# Patient Record
Sex: Female | Born: 1970
Health system: Southern US, Community
[De-identification: ages and names within clinical notes are randomized; demographics above are authoritative.]

## PROBLEM LIST (undated history)

## (undated) DIAGNOSIS — Z8489 Family history of other specified conditions: Secondary | ICD-10-CM

## (undated) DIAGNOSIS — H8110 Benign paroxysmal vertigo, unspecified ear: Secondary | ICD-10-CM

## (undated) DIAGNOSIS — T4145XA Adverse effect of unspecified anesthetic, initial encounter: Secondary | ICD-10-CM

## (undated) DIAGNOSIS — T8859XA Other complications of anesthesia, initial encounter: Secondary | ICD-10-CM

## (undated) DIAGNOSIS — T7840XA Allergy, unspecified, initial encounter: Secondary | ICD-10-CM

## (undated) DIAGNOSIS — R112 Nausea with vomiting, unspecified: Secondary | ICD-10-CM

## (undated) DIAGNOSIS — M1990 Unspecified osteoarthritis, unspecified site: Secondary | ICD-10-CM

## (undated) DIAGNOSIS — Z9889 Other specified postprocedural states: Secondary | ICD-10-CM

## (undated) HISTORY — PX: KNEE SURGERY: SHX244

## (undated) HISTORY — DX: Allergy, unspecified, initial encounter: T78.40XA

## (undated) HISTORY — PX: CHOLECYSTECTOMY: SHX55

## (undated) HISTORY — DX: Unspecified osteoarthritis, unspecified site: M19.90

## (undated) HISTORY — PX: CARPAL TUNNEL RELEASE: SHX101

## (undated) HISTORY — PX: TONSILLECTOMY: SUR1361

---

## 1998-08-11 ENCOUNTER — Inpatient Hospital Stay (HOSPITAL_COMMUNITY): Admission: AD | Admit: 1998-08-11 | Discharge: 1998-08-14 | Payer: Self-pay | Admitting: Obstetrics & Gynecology

## 1998-08-11 ENCOUNTER — Inpatient Hospital Stay (HOSPITAL_COMMUNITY): Admission: AD | Admit: 1998-08-11 | Discharge: 1998-08-11 | Payer: Self-pay | Admitting: Obstetrics and Gynecology

## 1999-09-02 ENCOUNTER — Emergency Department (HOSPITAL_COMMUNITY): Admission: EM | Admit: 1999-09-02 | Discharge: 1999-09-02 | Payer: Self-pay | Admitting: Emergency Medicine

## 1999-09-02 ENCOUNTER — Encounter: Payer: Self-pay | Admitting: Emergency Medicine

## 1999-09-30 ENCOUNTER — Other Ambulatory Visit: Admission: RE | Admit: 1999-09-30 | Discharge: 1999-09-30 | Payer: Self-pay | Admitting: Obstetrics and Gynecology

## 2000-09-03 ENCOUNTER — Ambulatory Visit (HOSPITAL_BASED_OUTPATIENT_CLINIC_OR_DEPARTMENT_OTHER): Admission: RE | Admit: 2000-09-03 | Discharge: 2000-09-03 | Payer: Self-pay

## 2000-10-04 ENCOUNTER — Other Ambulatory Visit: Admission: RE | Admit: 2000-10-04 | Discharge: 2000-10-04 | Payer: Self-pay | Admitting: Obstetrics and Gynecology

## 2001-11-14 ENCOUNTER — Other Ambulatory Visit: Admission: RE | Admit: 2001-11-14 | Discharge: 2001-11-14 | Payer: Self-pay | Admitting: Obstetrics and Gynecology

## 2002-12-06 ENCOUNTER — Other Ambulatory Visit: Admission: RE | Admit: 2002-12-06 | Discharge: 2002-12-06 | Payer: Self-pay | Admitting: Obstetrics and Gynecology

## 2004-03-05 ENCOUNTER — Other Ambulatory Visit: Admission: RE | Admit: 2004-03-05 | Discharge: 2004-03-05 | Payer: Self-pay | Admitting: Obstetrics and Gynecology

## 2005-07-01 ENCOUNTER — Other Ambulatory Visit: Admission: RE | Admit: 2005-07-01 | Discharge: 2005-07-01 | Payer: Self-pay | Admitting: Obstetrics and Gynecology

## 2009-01-23 ENCOUNTER — Ambulatory Visit: Payer: Self-pay | Admitting: Family Medicine

## 2009-01-23 DIAGNOSIS — M549 Dorsalgia, unspecified: Secondary | ICD-10-CM | POA: Insufficient documentation

## 2009-03-26 ENCOUNTER — Ambulatory Visit: Payer: Self-pay | Admitting: Internal Medicine

## 2009-03-26 DIAGNOSIS — H5500 Unspecified nystagmus: Secondary | ICD-10-CM | POA: Insufficient documentation

## 2009-03-26 DIAGNOSIS — H5702 Anisocoria: Secondary | ICD-10-CM

## 2009-03-26 DIAGNOSIS — R42 Dizziness and giddiness: Secondary | ICD-10-CM | POA: Insufficient documentation

## 2009-03-28 ENCOUNTER — Telehealth: Payer: Self-pay | Admitting: Internal Medicine

## 2009-08-14 ENCOUNTER — Ambulatory Visit: Payer: Self-pay | Admitting: Family Medicine

## 2009-08-14 DIAGNOSIS — J019 Acute sinusitis, unspecified: Secondary | ICD-10-CM

## 2009-08-20 ENCOUNTER — Telehealth (INDEPENDENT_AMBULATORY_CARE_PROVIDER_SITE_OTHER): Payer: Self-pay | Admitting: *Deleted

## 2010-11-24 ENCOUNTER — Encounter: Payer: Self-pay | Admitting: Internal Medicine

## 2011-03-20 NOTE — Op Note (Signed)
[place]  Patient:    Stacie Webster, Stacie Webster                     MRN: [id number] Proc. Date: 09/03/00 Adm. Date:  [id number] Attending:  Meredith Leeds CC:         Rocco Serene, M.D.   Operative Report  PREOPERATIVE DIAGNOSIS:  Bilateral varicose veins.  POSTOPERATIVE DIAGNOSIS:  Bilateral varicose veins.  OPERATION:  Bilateral transilluminated powered phlebectomy.  SURGEON:  Zigmund Daniel, M.D.  ANESTHESIA:  General.  DESCRIPTION OF PROCEDURE:  Before the patient came into the operating room, I had her stand and I drew circles around the areas of varicosity which were visible.  After adequate anesthesia, monitoring, and routine preparation and draping of the lower extremities, I made small incisions near the varicose clusters and infused dilute local anesthetic to provide tumescent anesthesia. I noted the varicose veins.  I made counter incisions and put in the power resector, and removed the varicose clusters.  Since there was no evidence of any abnormality of the long or short saphenous vein, and the clusters were all on the posterior and lateral aspect of the thighs and legs, I did not approach or in any way treat the long or short saphenous trunks.  After removal of the varicosities, I put a good deal more of anesthetic solution, sucked it out as much as possible, and squeezed it out.  I closed all incisions with Steri-Strips and applied a bulky compressive bandage.  The procedure was essentially identical on both legs. DD:  09/03/00 TD:  09/03/00 Job: [id number]

## 2012-09-09 ENCOUNTER — Encounter: Payer: Self-pay | Admitting: Family Medicine

## 2012-09-09 ENCOUNTER — Ambulatory Visit (INDEPENDENT_AMBULATORY_CARE_PROVIDER_SITE_OTHER): Payer: 59 | Admitting: Family Medicine

## 2012-09-09 VITALS — BP 120/84 | HR 88 | Temp 98.0°F | Resp 16 | Wt 225.4 lb

## 2012-09-09 DIAGNOSIS — L089 Local infection of the skin and subcutaneous tissue, unspecified: Secondary | ICD-10-CM | POA: Insufficient documentation

## 2012-09-09 MED ORDER — DOXYCYCLINE HYCLATE 100 MG PO TABS: 100.0000 mg | ORAL_TABLET | Freq: Two times a day (BID) | ORAL | Status: DC

## 2012-09-09 NOTE — Progress Notes (Signed)
  Subjective:    Patient ID: Stacie Webster, female    DOB: May 31, 1971, 41 y.o.   MRN: [id number]  HPI Bug bite- noticed area on L thigh on Monday AM.  As the week has gone on developed surrounding ring of redness, induration, warmth, and increased pain.  Has central hole.  [place] bite or sting.  No fevers.   Review of Systems For ROS see HPI     Objective:   Physical Exam  Vitals reviewed. Constitutional: She appears well-developed and well-nourished. No distress.  Skin: Skin is warm and dry. There is erythema (5 cm circular area of erythema w/ central induration surrounding central pore/scab.  no fluctuance or drainage.  + TTP).          Assessment & Plan:

## 2012-09-09 NOTE — Patient Instructions (Addendum)
Start the Doxy twice daily- take w/ food!! Hot compresses If the redness races past the circle- call or go to ER Hang in there!!!

## 2012-09-09 NOTE — Assessment & Plan Note (Signed)
New.  Pt w/ obvious infection on L thigh.  No area to be drained.  Start Doxy.  Reviewed supportive care and red flags that should prompt return.  Pt expressed understanding and is in agreement w/ plan.

## 2012-12-19 ENCOUNTER — Ambulatory Visit (INDEPENDENT_AMBULATORY_CARE_PROVIDER_SITE_OTHER): Payer: 59 | Admitting: Family Medicine

## 2012-12-19 ENCOUNTER — Encounter: Payer: Self-pay | Admitting: Family Medicine

## 2012-12-19 VITALS — BP 118/80 | HR 90 | Temp 98.4°F | Ht 65.0 in | Wt 229.4 lb

## 2012-12-19 DIAGNOSIS — J309 Allergic rhinitis, unspecified: Secondary | ICD-10-CM

## 2012-12-19 DIAGNOSIS — J019 Acute sinusitis, unspecified: Secondary | ICD-10-CM

## 2012-12-19 MED ORDER — AZITHROMYCIN 250 MG PO TABS: ORAL_TABLET | ORAL | Status: DC

## 2012-12-19 MED ORDER — MOMETASONE FUROATE 50 MCG/ACT NA SUSP: NASAL | Status: DC

## 2012-12-19 MED ORDER — PROMETHAZINE-DM 6.25-15 MG/5ML PO SYRP: 5.0000 mL | ORAL_SOLUTION | Freq: Four times a day (QID) | ORAL | Status: DC | PRN

## 2012-12-19 NOTE — Assessment & Plan Note (Signed)
New.  Start nasal steroid.  Reviewed supportive care and red flags that should prompt return.  Pt expressed understanding and is in agreement w/ plan.

## 2012-12-19 NOTE — Progress Notes (Signed)
  Subjective:    Patient ID: Stacie Webster, female    DOB: 1971-04-15, 42 y.o.   MRN: [id number]  HPI Sinus pressure- sxs started 5 days ago.  Prior to this had viral illness for 7-10 days.  Now w/ facial pain/pressure, L>R.  + PND.  Cough is worse at night.  No improvement w/ husband's Cheratussin.  L ear fullness.  No fever.  + sick contacts.  No tooth pain.  Allergic to cipro/PCN.  'the cough is horrendous'.   Review of Systems For ROS see HPI     Objective:   Physical Exam  Vitals reviewed. Constitutional: She appears well-developed and well-nourished. No distress.  HENT:  Head: Normocephalic and atraumatic.  Right Ear: Tympanic membrane normal.  Left Ear: Tympanic membrane normal.  Nose: Mucosal edema and rhinorrhea present. Right sinus exhibits no maxillary sinus tenderness and no frontal sinus tenderness. Left sinus exhibits maxillary sinus tenderness and frontal sinus tenderness.  Mouth/Throat: Uvula is midline and mucous membranes are normal. Posterior oropharyngeal erythema present. No oropharyngeal exudate.  Eyes: Conjunctivae and EOM are normal. Pupils are equal, round, and reactive to light.  Neck: Normal range of motion. Neck supple.  Cardiovascular: Normal rate, regular rhythm and normal heart sounds.   Pulmonary/Chest: Effort normal and breath sounds normal. No respiratory distress. She has no wheezes.  Lymphadenopathy:    She has no cervical adenopathy.          Assessment & Plan:

## 2012-12-19 NOTE — Patient Instructions (Addendum)
This is an early sinus infection Start the Zpack as directed Add Mucinex to thin your congestion Start the nasal steroid- 2 sprays each nostril daily Drink plenty of fluids REST! Hang in there!

## 2012-12-19 NOTE — Assessment & Plan Note (Signed)
Pt's sxs and PE consistent w/ infxn.  Discussed starting Biaxin but pt prefers to start Zpack.  Cough meds prn.  Reviewed supportive care and red flags that should prompt return.  Pt expressed understanding and is in agreement w/ plan.

## 2013-09-01 ENCOUNTER — Ambulatory Visit (INDEPENDENT_AMBULATORY_CARE_PROVIDER_SITE_OTHER): Payer: 59 | Admitting: Family Medicine

## 2013-09-01 ENCOUNTER — Encounter: Payer: Self-pay | Admitting: Family Medicine

## 2013-09-01 VITALS — BP 120/80 | HR 88 | Temp 98.1°F | Resp 16 | Wt 221.1 lb

## 2013-09-01 DIAGNOSIS — L02419 Cutaneous abscess of limb, unspecified: Secondary | ICD-10-CM | POA: Insufficient documentation

## 2013-09-01 MED ORDER — DOXYCYCLINE HYCLATE 100 MG PO TABS: 100.0000 mg | ORAL_TABLET | Freq: Two times a day (BID) | ORAL | Status: DC

## 2013-09-01 NOTE — Progress Notes (Signed)
  Subjective:    Patient ID: Stacie Webster, female    DOB: [birth year], 42 y.o.   MRN: [id number]  HPI Bug bite- 6 weeks ago had area that appeared to be a bug bite on R thigh.  Was able to drain the area and 'get gunk out'.  Now w/ a 'crater like scar'.  2 inches lateral to initial wound, now w/ 'exact same' lesion.  Central pore w/ surrounding scab and 3.5" of surrounding erythema and induration.  Area is itchy unless pressure is applied and then painful.  No current drainage.   Review of Systems For ROS see HPI     Objective:   Physical Exam  Vitals reviewed. Constitutional: She appears well-developed and well-nourished. No distress.  Skin: Skin is warm and dry. There is erythema.  3.5" area of erythema and induration on R anterior thigh w/ central pore.  + TTP.  No fluctuance or drainable pocket          Assessment & Plan:

## 2013-09-01 NOTE — Patient Instructions (Signed)
Follow up as needed Start the Doxy twice daily- take w/ food Continue the antibacterial soap Switch your razor Call with any questions or concerns Happy Halloween!

## 2013-09-01 NOTE — Assessment & Plan Note (Signed)
New.  Suspect pt transferred infxn from original site to current site w/ razor.  Start abx.  No I&D today.  Reviewed supportive care and red flags that should prompt return.  Pt expressed understanding and is in agreement w/ plan.

## 2013-09-05 ENCOUNTER — Ambulatory Visit (INDEPENDENT_AMBULATORY_CARE_PROVIDER_SITE_OTHER): Payer: 59 | Admitting: Family Medicine

## 2013-09-05 ENCOUNTER — Encounter: Payer: Self-pay | Admitting: Family Medicine

## 2013-09-05 ENCOUNTER — Telehealth: Payer: Self-pay | Admitting: *Deleted

## 2013-09-05 VITALS — BP 118/80 | HR 80 | Temp 98.2°F | Resp 16 | Wt 220.0 lb

## 2013-09-05 DIAGNOSIS — L02419 Cutaneous abscess of limb, unspecified: Secondary | ICD-10-CM

## 2013-09-05 NOTE — Progress Notes (Signed)
  Subjective:    Patient ID: Stacie Webster, female    DOB: 1971-01-01, 42 y.o.   MRN: [id number]  HPI Cellulitis- started on abx on Friday for cellulitis/abscess.  Sunday had increased pain and redness- 'about an inch bigger than last week'.  Today redness has improved and less painful.  Some serous drainage.  No fevers.  Tolerating Doxy w/out difficulty.   Review of Systems For ROS see HPI     Objective:   Physical Exam  Vitals reviewed. Constitutional: She appears well-developed and well-nourished. No distress.  Skin: Skin is warm and dry. There is erythema (less prominent than Friday, surrounding central indurated area w/ central pore).          Assessment & Plan:

## 2013-09-05 NOTE — Telephone Encounter (Signed)
Patient left message on triage line stating that area on leg is about 1" larger than it was on Friday but the redness has decreased. Attempted to call pt back to schedule another appt, left message on voice mail to return our call.

## 2013-09-05 NOTE — Assessment & Plan Note (Signed)
Improving.  Area less red, less painful.  No fluctuance.  Not able to I&D.  Continue Doxy.  Reassurance provided.  Will follow.

## 2013-10-27 ENCOUNTER — Ambulatory Visit (INDEPENDENT_AMBULATORY_CARE_PROVIDER_SITE_OTHER): Payer: 59 | Admitting: Family Medicine

## 2013-10-27 ENCOUNTER — Encounter: Payer: Self-pay | Admitting: Family Medicine

## 2013-10-27 VITALS — BP 128/78 | HR 118 | Temp 98.1°F | Resp 16 | Wt 219.2 lb

## 2013-10-27 DIAGNOSIS — J019 Acute sinusitis, unspecified: Secondary | ICD-10-CM

## 2013-10-27 MED ORDER — CLARITHROMYCIN 500 MG PO TABS: 500.0000 mg | ORAL_TABLET | Freq: Two times a day (BID) | ORAL | Status: DC

## 2013-10-27 MED ORDER — PROMETHAZINE-DM 6.25-15 MG/5ML PO SYRP: 5.0000 mL | ORAL_SOLUTION | Freq: Four times a day (QID) | ORAL | Status: DC | PRN

## 2013-10-27 NOTE — Progress Notes (Signed)
Pre visit review using our clinic review tool, if applicable. No additional management support is needed unless otherwise documented below in the visit note. 

## 2013-10-27 NOTE — Patient Instructions (Signed)
Follow up as needed Start the Biaxin- 1 tab twice daily, take w/ food Drink plenty of fluids REST! Use the cough syrup as needed Alternate tylenol and ibuprofen for pain/fever Call with any questions or concerns Hang in there! Happy New Year!

## 2013-10-27 NOTE — Progress Notes (Signed)
   Subjective:    Patient ID: Stacie Webster, female    DOB: [birth year], 42 y.o.   MRN: [id number]  HPI URI- sxs started Tuesday, + nasal congestion.  Yesterday developed nasal pain- worse w/ bending over.  + PND, sore throat.  Neck pain.  + tooth pain.  No N/V/D.  + sick contacts.  Mild cough due to PND.   Review of Systems For ROS see HPI     Objective:   Physical Exam  Vitals reviewed. Constitutional: She appears well-developed and well-nourished. No distress.  HENT:  Head: Normocephalic and atraumatic.  Right Ear: Tympanic membrane normal.  Left Ear: Tympanic membrane normal.  Nose: Mucosal edema and rhinorrhea present. Right sinus exhibits maxillary sinus tenderness. Right sinus exhibits no frontal sinus tenderness. Left sinus exhibits maxillary sinus tenderness. Left sinus exhibits no frontal sinus tenderness.  Mouth/Throat: Uvula is midline and mucous membranes are normal. Posterior oropharyngeal erythema present. No oropharyngeal exudate.  Eyes: Conjunctivae and EOM are normal. Pupils are equal, round, and reactive to light.  Neck: Normal range of motion. Neck supple.  Cardiovascular: Normal rate, regular rhythm and normal heart sounds.   Pulmonary/Chest: Effort normal and breath sounds normal. No respiratory distress. She has no wheezes.  Lymphadenopathy:    She has no cervical adenopathy.          Assessment & Plan:

## 2013-10-27 NOTE — Assessment & Plan Note (Signed)
Pt's sxs and PE consistent w/ infxn.  Start abx.  Cough meds prn.  Reviewed supportive care and red flags that should prompt return.  Pt expressed understanding and is in agreement w/ plan.  

## 2013-11-06 ENCOUNTER — Telehealth: Payer: Self-pay | Admitting: *Deleted

## 2013-11-06 NOTE — Telephone Encounter (Signed)
Patient left message on triage line c/o PND. Advised via vm to try OTC antihistamines and to continue nasonex as prescribed but if still no relief in a few days she can call the office to be seen again.

## 2013-11-10 ENCOUNTER — Telehealth: Payer: Self-pay | Admitting: *Deleted

## 2013-11-10 MED ORDER — DOXYCYCLINE HYCLATE 100 MG PO TABS: 100.0000 mg | ORAL_TABLET | Freq: Two times a day (BID) | ORAL | Status: DC

## 2013-11-10 NOTE — Telephone Encounter (Signed)
Med filled and pt notified.  

## 2013-11-10 NOTE — Telephone Encounter (Signed)
Ok for Doxy 100mg  BID x10 days- if no improvement after this round of abx will need f/u OV

## 2013-11-10 NOTE — Telephone Encounter (Addendum)
Patient was seen on 10/27/2013 for a sinus infection. She called back on 11/06/2013 and stated that she was not feeling better and a nurse told her to give it sometime for the antibiotic to work. Now patient stated that she is still not feeling any better and she has finished her antibiotic.

## 2013-11-15 ENCOUNTER — Telehealth: Payer: Self-pay | Admitting: *Deleted

## 2013-11-15 MED ORDER — FLUTICASONE PROPIONATE 50 MCG/ACT NA SUSP: 2.0000 | Freq: Every day | NASAL | Status: DC

## 2013-11-15 NOTE — Telephone Encounter (Signed)
Med filled, Detailed message left informing pt.

## 2013-11-15 NOTE — Telephone Encounter (Signed)
Patient called and stated that she is not better after taking doxycycline and would like to know if she would benefit from a nasal steroid. She states her main symptoms is nasal congestion. Please advise. JG//CMA

## 2013-11-15 NOTE — Telephone Encounter (Signed)
Yes- please add Flonase 2 sprays each nostril daily

## 2013-12-19 ENCOUNTER — Other Ambulatory Visit (HOSPITAL_COMMUNITY): Payer: Self-pay | Admitting: Specialist

## 2013-12-19 ENCOUNTER — Ambulatory Visit (HOSPITAL_COMMUNITY)
Admission: RE | Admit: 2013-12-19 | Discharge: 2013-12-19 | Disposition: A | Discharge status: Home / Self Care | Payer: 59 | Source: Ambulatory Visit | Attending: Specialist | Admitting: Specialist

## 2013-12-19 DIAGNOSIS — X58XXXA Exposure to other specified factors, initial encounter: Secondary | ICD-10-CM | POA: Insufficient documentation

## 2013-12-19 DIAGNOSIS — M25561 Pain in right knee: Secondary | ICD-10-CM

## 2013-12-19 DIAGNOSIS — Y9373 Activity, racquet and hand sports: Secondary | ICD-10-CM | POA: Insufficient documentation

## 2013-12-19 DIAGNOSIS — M1710 Unilateral primary osteoarthritis, unspecified knee: Secondary | ICD-10-CM | POA: Insufficient documentation

## 2016-01-09 ENCOUNTER — Encounter: Payer: Self-pay | Admitting: *Deleted

## 2016-01-09 ENCOUNTER — Emergency Department
Admission: EM | Admit: 2016-01-09 | Discharge: 2016-01-09 | Disposition: A | Discharge status: Home / Self Care | Payer: 59 | Source: Home / Self Care | Attending: Emergency Medicine | Admitting: Emergency Medicine

## 2016-01-09 DIAGNOSIS — J111 Influenza due to unidentified influenza virus with other respiratory manifestations: Secondary | ICD-10-CM | POA: Diagnosis not present

## 2016-01-09 MED ORDER — OSELTAMIVIR PHOSPHATE 75 MG PO CAPS: ORAL_CAPSULE | ORAL | Status: DC

## 2016-01-09 MED FILL — OSELTAMIVIR PHOS 75 MG CAP: 75 | 5 days supply | Qty: 10 | Fill #0

## 2016-01-09 NOTE — ED Provider Notes (Signed)
CSN: [id number]     Arrival date & time 01/09/16  1155 History   First MD Initiated Contact with Patient 01/09/16 1209     Chief Complaint  Patient presents with  . Fever  . Generalized Body Aches  . Cough    HPI 45 year old female. Her son recently had symptoms consistent with influenza  HPI : Acute onset of Flu symptoms for about 1 day. Fever to 102 with chills, sweats, myalgias, fatigue, headache. Symptoms are progressively worsening, despite trying OTC fever reducing medicine and rest and fluids. Has decreased appetite, but tolerating some liquids by mouth. No history of recent tick bite.  Review of Systems: Positive for fatigue, mild nasal congestion, mild sore throat, mild swollen anterior neck glands, mild cough. Negative for acute vision changes, stiff neck, focal weakness, syncope, seizures, respiratory distress, vomiting, diarrhea, GU symptoms, new rash.  History reviewed. No pertinent past medical history. History reviewed. No pertinent past surgical history. History reviewed. No pertinent family history. Social History  Substance Use Topics  . Smoking status: Never Smoker   . Smokeless tobacco: None  . Alcohol Use: No   OB History    No data available     Review of Systems  All other systems reviewed and are negative.   Allergies  Ciprofloxacin; Meloxicam; Penicillins; and Quinolones  Home Medications   Prior to Admission medications   Medication Sig Start Date End Date Taking? Authorizing Provider  fluticasone (FLONASE) 50 MCG/ACT nasal spray Place 2 sprays into both nostrils daily. 11/15/13   Midge Minium, MD  mometasone (NASONEX) 50 MCG/ACT nasal spray 2 sprays each nostril daily 12/19/12   Midge Minium, MD  oseltamivir (TAMIFLU) 75 MG capsule Starting today, take 1 capsule by mouth twice a day for 5 days. 01/09/16   Jacqulyn Cane, MD   Meds Ordered and Administered this Visit  Medications - No data to display  BP 129/87 mmHg  Pulse 116   Temp(Src) 98.5 F (36.9 C) (Oral)  Resp 18  Wt 231 lb (104.781 kg)  SpO2 98%  LMP 12/19/2015 No data found.   Physical Exam  Constitutional: She appears well-developed and well-nourished.  Non-toxic appearance. She appears ill (very fatigued, but no cardiorespiratory distress). No distress.  HENT:  Head: Normocephalic and atraumatic.  Right Ear: Tympanic membrane and external ear normal.  Left Ear: Tympanic membrane and external ear normal.  Nose: Rhinorrhea present.  Mouth/Throat: Mucous membranes are normal. No oropharyngeal exudate or posterior oropharyngeal erythema (Minimal injection).  Eyes: Conjunctivae are normal. Right eye exhibits no discharge. Left eye exhibits no discharge. No scleral icterus.  Neck: Neck supple.  Cardiovascular: Normal rate, regular rhythm and normal heart sounds.   Pulmonary/Chest: Breath sounds normal. No stridor. No respiratory distress. She has no wheezes. She has no rales.  Abdominal: Soft. There is no tenderness.  Musculoskeletal: She exhibits no edema.  Lymphadenopathy:    She has cervical adenopathy (mild shoddy anterior cervical nodes).  Neurological: She is alert.  Skin: Skin is warm and intact. No rash noted. She is diaphoretic.  Psychiatric: She has a normal mood and affect.  Nursing note and vitals reviewed.  Oxygen saturation 98% room air. Pulse repeated 104, regular ED Course  Procedures (including critical care time)  Labs Review Labs Reviewed - No data to display  Imaging Review No results found.    MDM   1. Influenza    Clinically has acute influenza. No evidence of bacterial infection. Given that there is local prevalence of  influenza, patient declined any flu testing and prefers to treat with Tamiflu, and I agree. Discharge Medication List as of 01/09/2016 12:50 PM    START taking these medications   Details  oseltamivir (TAMIFLU) 75 MG capsule Starting today, take 1 capsule by mouth twice a day for 5 days., Normal        after patient request and discussion of risks benefits alternatives, I was agreeable with writing a prescription for Tamiflu for her husband, Ronny Bacon date of birth 06/27/1958. He has no known drug allergies. He will fill the Tamiflu if he has acute influenza symptoms.  An After Visit Summary was printed and given to the patient.  Follow-up with your primary care doctor in 5-7 days if not improving, or sooner if symptoms become worse. Precautions discussed. Red flags discussed. Questions invited and answered. Patient voiced understanding and agreement.     Jacqulyn Cane, MD 01/09/16 [phone]

## 2016-01-09 NOTE — Discharge Instructions (Signed)

## 2016-01-09 NOTE — ED Notes (Signed)
Pt c/o fever, aches, HA, dry cough and sore throat x yesterday. T-max 102 this AM. Taken Sudafed and dayquil otc. Received flu vac this season.

## 2016-02-17 DIAGNOSIS — Z1231 Encounter for screening mammogram for malignant neoplasm of breast: Secondary | ICD-10-CM | POA: Diagnosis not present

## 2016-03-23 DIAGNOSIS — Z01419 Encounter for gynecological examination (general) (routine) without abnormal findings: Secondary | ICD-10-CM | POA: Diagnosis not present

## 2016-03-23 DIAGNOSIS — Z6839 Body mass index (BMI) 39.0-39.9, adult: Secondary | ICD-10-CM | POA: Diagnosis not present

## 2016-04-13 DIAGNOSIS — Z1321 Encounter for screening for nutritional disorder: Secondary | ICD-10-CM | POA: Diagnosis not present

## 2016-04-13 DIAGNOSIS — Z130 Encounter for screening for diseases of the blood and blood-forming organs and certain disorders involving the immune mechanism: Secondary | ICD-10-CM | POA: Diagnosis not present

## 2016-04-13 DIAGNOSIS — Z13228 Encounter for screening for other metabolic disorders: Secondary | ICD-10-CM | POA: Diagnosis not present

## 2016-04-13 DIAGNOSIS — Z1329 Encounter for screening for other suspected endocrine disorder: Secondary | ICD-10-CM | POA: Diagnosis not present

## 2016-04-13 DIAGNOSIS — Z131 Encounter for screening for diabetes mellitus: Secondary | ICD-10-CM | POA: Diagnosis not present

## 2016-04-13 DIAGNOSIS — Z13220 Encounter for screening for lipoid disorders: Secondary | ICD-10-CM | POA: Diagnosis not present

## 2016-08-21 ENCOUNTER — Telehealth: Payer: Self-pay | Admitting: Family Medicine

## 2016-08-21 NOTE — Telephone Encounter (Signed)
Pt called in today for an acute appt for swollen, red eye. Pt has not been seen in over three years and when I stated that she would need to re est she got upset and asked why and that that was insane that she not needed to see KT and that someone should have called and told her. Jess came by and heard conversation and asked if pt could be here in 30 mins for an 11:00 appt. Pt states that she is going out of town today and could not make that appt offered. Pt wants to be seen on Monday. I let her know that I would have to send a message to KT for scheduling, she then got upset again asking if you can offer me an appt for today why can you not give me one for Monday? I let pt know that KT would have to look at her schedule to see if she had time to fit in a new patient appt since there is not one avail at this time.

## 2016-08-21 NOTE — Telephone Encounter (Signed)
Ok to schedule on Monday in any available spot.  15 minutes is fine

## 2016-08-24 ENCOUNTER — Encounter: Payer: Self-pay | Admitting: Family Medicine

## 2016-08-24 ENCOUNTER — Ambulatory Visit (INDEPENDENT_AMBULATORY_CARE_PROVIDER_SITE_OTHER): Payer: 59 | Admitting: Family Medicine

## 2016-08-24 VITALS — BP 124/82 | HR 76 | Temp 98.2°F | Resp 16 | Ht 65.0 in | Wt 232.2 lb

## 2016-08-24 DIAGNOSIS — H00011 Hordeolum externum right upper eyelid: Secondary | ICD-10-CM

## 2016-08-24 MED ORDER — POLYMYXIN B-TRIMETHOPRIM 10000-0.1 UNIT/ML-% OP SOLN: OPHTHALMIC | 0 refills | Status: DC

## 2016-08-24 MED FILL — POLYMYXIN B/TMP EYE DROPS: 10000-0.1 | 5 days supply | Qty: 10 | Fill #0

## 2016-08-24 NOTE — Patient Instructions (Signed)
Follow up as needed Use the antibiotic eye drops 3x/day for next 5 days Try and avoid rubbing eyes as this can worsen inflammation Add OTC Claritin or Zyrtec for the allergy component Call with any questions or concerns Hang in there!!!

## 2016-08-24 NOTE — Progress Notes (Signed)
Pre visit review using our clinic review tool, if applicable. No additional management support is needed unless otherwise documented below in the visit note. 

## 2016-08-24 NOTE — Progress Notes (Signed)
   Subjective:    Patient ID: Stacie Webster, female    DOB: [birth year], 45 y.o.   MRN: [id number]  HPI Conjunctivitis- sxs started Wednesday.  Started warm compresses, OTC allergy drops.  Pt reports R upper eyelid was swollen Friday, Saturday, Sunday.  Yesterday had drainage of blood, pus in corner of R eye and area improved.  Has pain w/ tight closure of eye.  Yesterday a 'hard thing' expressed from upper lid leaving a 'hole'.  Mild tenderness in corner of L eye.   Review of Systems For ROS see HPI     Objective:   Physical Exam  Constitutional: She is oriented to person, place, and time. She appears well-developed and well-nourished. No distress.  HENT:  Head: Normocephalic and atraumatic.  Nose: Nose normal.  Mouth/Throat: Oropharynx is clear and moist.  Eyes: EOM are normal. Pupils are equal, round, and reactive to light. Right eye exhibits discharge. Left eye exhibits no discharge. No scleral icterus.  Infected stye of R upper eyelid in medial corner w/ surrounding erythema and induration  Neck: Normal range of motion. Neck supple.  Lymphadenopathy:    She has no cervical adenopathy.  Neurological: She is alert and oriented to person, place, and time.  Skin: Skin is warm and dry.  Psychiatric: She has a normal mood and affect. Her behavior is normal. Thought content normal.  Vitals reviewed.         Assessment & Plan:  Infected stye- new.  Center has been expressed but she has surrounding erythema and induration.  Start abx eye drops for localized infxn.  Reviewed supportive care and red flags that should prompt return.  Pt expressed understanding and is in agreement w/ plan.

## 2016-08-31 DIAGNOSIS — H0012 Chalazion right lower eyelid: Secondary | ICD-10-CM | POA: Diagnosis not present

## 2017-03-31 DIAGNOSIS — Z01419 Encounter for gynecological examination (general) (routine) without abnormal findings: Secondary | ICD-10-CM | POA: Diagnosis not present

## 2017-03-31 DIAGNOSIS — Z1231 Encounter for screening mammogram for malignant neoplasm of breast: Secondary | ICD-10-CM | POA: Diagnosis not present

## 2017-03-31 DIAGNOSIS — Z6836 Body mass index (BMI) 36.0-36.9, adult: Secondary | ICD-10-CM | POA: Diagnosis not present

## 2017-04-02 ENCOUNTER — Other Ambulatory Visit: Payer: Self-pay | Admitting: Obstetrics and Gynecology

## 2017-04-02 DIAGNOSIS — R928 Other abnormal and inconclusive findings on diagnostic imaging of breast: Secondary | ICD-10-CM

## 2017-04-05 ENCOUNTER — Ambulatory Visit
Admission: RE | Admit: 2017-04-05 | Discharge: 2017-04-05 | Disposition: A | Discharge status: Home / Self Care | Payer: 59 | Source: Ambulatory Visit | Attending: Obstetrics and Gynecology | Admitting: Obstetrics and Gynecology

## 2017-04-05 DIAGNOSIS — R928 Other abnormal and inconclusive findings on diagnostic imaging of breast: Secondary | ICD-10-CM

## 2017-04-07 ENCOUNTER — Other Ambulatory Visit: Payer: 59

## 2018-01-19 ENCOUNTER — Telehealth: Payer: Self-pay | Admitting: Internal Medicine

## 2018-01-19 NOTE — Telephone Encounter (Signed)
Dr.Gessner reviewed records and accepted patient to be scheduled for a direct colonoscopy. Patient has been scheduled for a direct colonoscopy in [place].

## 2018-01-19 NOTE — Telephone Encounter (Signed)
Pt states her father was diagnosed in his 23's with colon cancer. Records have been placed on Dr.Gessner's desk for review.

## 2018-02-21 ENCOUNTER — Ambulatory Visit (AMBULATORY_SURGERY_CENTER): Payer: Self-pay

## 2018-02-21 ENCOUNTER — Other Ambulatory Visit: Payer: Self-pay

## 2018-02-21 VITALS — Ht 65.0 in | Wt 232.0 lb

## 2018-02-21 DIAGNOSIS — Z800 Family history of malignant neoplasm of digestive organs: Secondary | ICD-10-CM

## 2018-02-21 NOTE — Progress Notes (Signed)
No egg or soy allergy known to patient  No issues with past sedation with any surgeries  or procedures, no intubation problems  No diet pills per patient No home 02 use per patient  No blood thinners per patient  Pt denies issues with constipation  No A fib or A flutter  EMMI video sent to pt's e mail , pt declined    

## 2018-02-23 ENCOUNTER — Encounter: Payer: Self-pay | Admitting: Internal Medicine

## 2018-03-09 ENCOUNTER — Other Ambulatory Visit: Payer: Self-pay

## 2018-03-09 ENCOUNTER — Ambulatory Visit (AMBULATORY_SURGERY_CENTER): Payer: 59 | Admitting: Internal Medicine

## 2018-03-09 ENCOUNTER — Encounter: Payer: Self-pay | Admitting: Internal Medicine

## 2018-03-09 VITALS — BP 108/69 | HR 75 | Temp 98.6°F | Resp 14 | Ht 65.0 in | Wt 232.0 lb

## 2018-03-09 DIAGNOSIS — Z1211 Encounter for screening for malignant neoplasm of colon: Secondary | ICD-10-CM

## 2018-03-09 DIAGNOSIS — Z800 Family history of malignant neoplasm of digestive organs: Secondary | ICD-10-CM

## 2018-03-09 MED ORDER — SODIUM CHLORIDE 0.9 % IV SOLN: 500.0000 mL | Freq: Once | INTRAVENOUS | Status: DC

## 2018-03-09 NOTE — Progress Notes (Signed)
Report given to PACU, vss 

## 2018-03-09 NOTE — Op Note (Signed)
[place] Patient Name: Stacie Webster Procedure Date: 03/09/2018 2:32 PM MRN: [id number] Endoscopist: Gatha Mayer , MD Age: 47 Referring MD:  Date of Birth: 1971/09/20 Gender: Female Account #: [id number] Procedure:                Colonoscopy Indications:              Colon cancer screening in patient at increased                            risk: Family history of colorectal cancer in                            multiple 2nd degree relatives Medicines:                Propofol per Anesthesia, Monitored Anesthesia Care Procedure:                Pre-Anesthesia Assessment:                           - Prior to the procedure, a History and Physical                            was performed, and patient medications and                            allergies were reviewed. The patient's tolerance of                            previous anesthesia was also reviewed. The risks                            and benefits of the procedure and the sedation                            options and risks were discussed with the patient.                            All questions were answered, and informed consent                            was obtained. Prior Anticoagulants: The patient has                            taken no previous anticoagulant or antiplatelet                            agents. ASA Grade Assessment: I - A normal, healthy                            patient. After reviewing the risks and benefits,                            the patient was deemed in satisfactory condition to  undergo the procedure.                           After obtaining informed consent, the colonoscope                            was passed under direct vision. Throughout the                            procedure, the patient's blood pressure, pulse, and                            oxygen saturations were monitored continuously. The                            Colonoscope was  introduced through the anus and                            advanced to the the cecum, identified by                            appendiceal orifice and ileocecal valve. The                            colonoscopy was performed without difficulty. The                            patient tolerated the procedure well. The quality                            of the bowel preparation was good. The bowel                            preparation used was Miralax. The ileocecal valve,                            appendiceal orifice, and rectum were photographed. Scope In: 2:37:31 PM Scope Out: 2:47:28 PM Scope Withdrawal Time: 0 hours 7 minutes 40 seconds  Total Procedure Duration: 0 hours 9 minutes 57 seconds  Findings:                 The perianal and digital rectal examinations were                            normal.                           Multiple diverticula were found in the sigmoid                            colon.                           The exam was otherwise without abnormality on  direct and retroflexion views. Complications:            No immediate complications. Estimated Blood Loss:     Estimated blood loss: none. Impression:               - Diverticulosis in the sigmoid colon.                           - The examination was otherwise normal on direct                            and retroflexion views.                           - No specimens collected. Recommendation:           - Patient has a contact number available for                            emergencies. The signs and symptoms of potential                            delayed complications were discussed with the                            patient. Return to normal activities tomorrow.                            Written discharge instructions were provided to the                            patient.                           - Resume previous diet.                           - Continue present  medications.                           - Repeat colonoscopy in 5 years for screening                            purposes. Father hx polyps, paternal grandmother                            and great-grandmother had colon cancer Gatha Mayer, MD 03/09/2018 2:55:04 PM This report has been signed electronically.

## 2018-03-09 NOTE — Patient Instructions (Addendum)
   No polyps today.  You do have diverticulosis - thickened muscle rings and pouches in the colon wall. Please read the handout about this condition.  Next routine colonoscopy or other screening test in 5 years 2024.  I appreciate the opportunity to care for you. Gatha Mayer, MD, FACG YOU HAD AN ENDOSCOPIC PROCEDURE TODAY AT [facility]:   Refer to the procedure report that was given to you for any specific questions about what was found during the examination.  If the procedure report does not answer your questions, please call your gastroenterologist to clarify.  If you requested that your care partner not be given the details of your procedure findings, then the procedure report has been included in a sealed envelope for you to review at your convenience later.  YOU SHOULD EXPECT: Some feelings of bloating in the abdomen. Passage of more gas than usual.  Walking can help get rid of the air that was put into your GI tract during the procedure and reduce the bloating. If you had a lower endoscopy (such as a colonoscopy or flexible sigmoidoscopy) you may notice spotting of blood in your stool or on the toilet paper. If you underwent a bowel prep for your procedure, you may not have a normal bowel movement for a few days.  Please Note:  You might notice some irritation and congestion in your nose or some drainage.  This is from the oxygen used during your procedure.  There is no need for concern and it should clear up in a day or so.  SYMPTOMS TO REPORT IMMEDIATELY:   Following lower endoscopy (colonoscopy or flexible sigmoidoscopy):  Excessive amounts of blood in the stool  Significant tenderness or worsening of abdominal pains  Swelling of the abdomen that is new, acute  Fever of 100F or higher  For urgent or emergent issues, a gastroenterologist can be reached at any hour by calling [phone].   DIET:  We do recommend a small meal at first, but then you  may proceed to your regular diet.  Drink plenty of fluids but you should avoid alcoholic beverages for 24 hours.  ACTIVITY:  You should plan to take it easy for the rest of today and you should NOT DRIVE or use heavy machinery until tomorrow (because of the sedation medicines used during the test).    FOLLOW UP: Our staff will call the number listed on your records the next business day following your procedure to check on you and address any questions or concerns that you may have regarding the information given to you following your procedure. If we do not reach you, we will leave a message.  However, if you are feeling well and you are not experiencing any problems, there is no need to return our call.  We will assume that you have returned to your regular daily activities without incident.  If any biopsies were taken you will be contacted by phone or by letter within the next 1-3 weeks.  Please call us at [phone] if you have not heard about the biopsies in 3 weeks.   Diverticulosis (handout given)   SIGNATURES/CONFIDENTIALITY: You and/or your care partner have signed paperwork which will be entered into your electronic medical record.  These signatures attest to the fact that that the information above on your After Visit Summary has been reviewed and is understood.  Full responsibility of the confidentiality of this discharge information lies with you and/or your care-partner.

## 2018-03-09 NOTE — Progress Notes (Signed)
Pt's states no medical or surgical changes since previsit or office visit. 

## 2018-03-10 ENCOUNTER — Telehealth: Payer: Self-pay

## 2018-03-10 NOTE — Telephone Encounter (Signed)
  Follow up Call-  Call back number 03/09/2018  Post procedure Call Back phone  # [phone]  Permission to leave phone message Yes  Some recent data might be hidden     Patient questions:  Do you have a fever, pain , or abdominal swelling? No. Pain Score  0 *  Have you tolerated food without any problems? Yes.    Have you been able to return to your normal activities? Yes.    Do you have any questions about your discharge instructions: Diet   No. Medications  No. Follow up visit  No.  Do you have questions or concerns about your Care? No.  Actions: * If pain score is 4 or above: No action needed, pain <4.

## 2018-04-27 DIAGNOSIS — M1711 Unilateral primary osteoarthritis, right knee: Secondary | ICD-10-CM | POA: Diagnosis not present

## 2018-05-11 MED FILL — traMADol HCL 50 MG TABS: 50 | 8 days supply | Qty: 30 | Fill #0

## 2018-06-01 DIAGNOSIS — Z1231 Encounter for screening mammogram for malignant neoplasm of breast: Secondary | ICD-10-CM | POA: Diagnosis not present

## 2018-06-01 DIAGNOSIS — Z6839 Body mass index (BMI) 39.0-39.9, adult: Secondary | ICD-10-CM | POA: Diagnosis not present

## 2018-06-01 DIAGNOSIS — Z01419 Encounter for gynecological examination (general) (routine) without abnormal findings: Secondary | ICD-10-CM | POA: Diagnosis not present

## 2018-06-15 DIAGNOSIS — M1711 Unilateral primary osteoarthritis, right knee: Secondary | ICD-10-CM | POA: Diagnosis not present

## 2018-06-22 DIAGNOSIS — M1711 Unilateral primary osteoarthritis, right knee: Secondary | ICD-10-CM | POA: Diagnosis not present

## 2018-06-29 DIAGNOSIS — M1711 Unilateral primary osteoarthritis, right knee: Secondary | ICD-10-CM | POA: Diagnosis not present

## 2018-06-30 LAB — HM MAMMOGRAPHY: HM Mammogram: NORMAL (ref 0–4)

## 2018-06-30 LAB — HM PAP SMEAR

## 2018-08-08 DIAGNOSIS — M1711 Unilateral primary osteoarthritis, right knee: Secondary | ICD-10-CM | POA: Diagnosis not present

## 2018-08-08 DIAGNOSIS — M25569 Pain in unspecified knee: Secondary | ICD-10-CM | POA: Diagnosis not present

## 2018-08-08 MED FILL — CELECOXIB 200 MG CAP: 200 | 30 days supply | Qty: 30 | Fill #0

## 2018-08-29 DIAGNOSIS — D224 Melanocytic nevi of scalp and neck: Secondary | ICD-10-CM | POA: Diagnosis not present

## 2018-08-29 DIAGNOSIS — L430 Hypertrophic lichen planus: Secondary | ICD-10-CM | POA: Diagnosis not present

## 2018-08-29 DIAGNOSIS — D2262 Melanocytic nevi of left upper limb, including shoulder: Secondary | ICD-10-CM | POA: Diagnosis not present

## 2018-08-29 DIAGNOSIS — D485 Neoplasm of uncertain behavior of skin: Secondary | ICD-10-CM | POA: Diagnosis not present

## 2018-08-29 DIAGNOSIS — D1801 Hemangioma of skin and subcutaneous tissue: Secondary | ICD-10-CM | POA: Diagnosis not present

## 2018-08-29 DIAGNOSIS — D2261 Melanocytic nevi of right upper limb, including shoulder: Secondary | ICD-10-CM | POA: Diagnosis not present

## 2018-08-29 DIAGNOSIS — D225 Melanocytic nevi of trunk: Secondary | ICD-10-CM | POA: Diagnosis not present

## 2018-08-29 DIAGNOSIS — L821 Other seborrheic keratosis: Secondary | ICD-10-CM | POA: Diagnosis not present

## 2018-09-05 MED FILL — CELECOXIB 200 MG CAP: 200 | 30 days supply | Qty: 30 | Fill #1

## 2018-10-10 MED FILL — CELECOXIB 200 MG CAP: 200 | 30 days supply | Qty: 30 | Fill #2

## 2018-11-07 ENCOUNTER — Encounter: Payer: Self-pay | Admitting: Family Medicine

## 2018-11-07 ENCOUNTER — Ambulatory Visit (INDEPENDENT_AMBULATORY_CARE_PROVIDER_SITE_OTHER): Payer: No Typology Code available for payment source | Admitting: Family Medicine

## 2018-11-07 ENCOUNTER — Other Ambulatory Visit: Payer: Self-pay

## 2018-11-07 VITALS — BP 138/88 | HR 86 | Temp 97.9°F | Resp 16 | Ht 65.5 in | Wt 242.4 lb

## 2018-11-07 DIAGNOSIS — E669 Obesity, unspecified: Secondary | ICD-10-CM | POA: Diagnosis not present

## 2018-11-07 DIAGNOSIS — M25561 Pain in right knee: Secondary | ICD-10-CM

## 2018-11-07 DIAGNOSIS — G8929 Other chronic pain: Secondary | ICD-10-CM

## 2018-11-07 NOTE — Assessment & Plan Note (Signed)
Ongoing issue for pt but deteriorated recently w/ weight gain.  Pt declines labs, says she has this done at GYN.  Encouraged healthy diet and regular exercise.  Will follow.

## 2018-11-07 NOTE — Progress Notes (Signed)
   Subjective:    Patient ID: Stacie Webster, female    DOB: June 03, 1971, 48 y.o.   MRN: [id number]  HPI Obesity- pt's up 10 lbs since last visit.  BMI now 39.72.  Playing tennis but limited exercise due to knee pain.  Pt declines lab work today.  No CP, SOB, HAs, visual changes, edema.  R knee pain- has been seeing Ortho.  Has upcoming appt and plan is to proceed w/ MRI.  Has been seeing Dr Theda Sers at [facility].  UTD on mammo, pap  Review of Systems For ROS see HPI     Objective:   Physical Exam Vitals signs reviewed.  Constitutional:      General: She is not in acute distress.    Appearance: She is well-developed. She is obese.  HENT:     Head: Normocephalic and atraumatic.  Eyes:     Conjunctiva/sclera: Conjunctivae normal.     Pupils: Pupils are equal, round, and reactive to light.  Neck:     Musculoskeletal: Normal range of motion and neck supple.     Thyroid: No thyromegaly.  Cardiovascular:     Rate and Rhythm: Normal rate and regular rhythm.     Heart sounds: Normal heart sounds. No murmur.  Pulmonary:     Effort: Pulmonary effort is normal. No respiratory distress.     Breath sounds: Normal breath sounds.  Abdominal:     General: There is no distension.     Palpations: Abdomen is soft.     Tenderness: There is no abdominal tenderness.  Lymphadenopathy:     Cervical: No cervical adenopathy.  Skin:    General: Skin is warm and dry.  Neurological:     Mental Status: She is alert and oriented to person, place, and time.  Psychiatric:        Behavior: Behavior normal.           Assessment & Plan:  Chronic knee pain- new to provider, ongoing for pt.  She is already seeing Dr Theda Sers at [facility] but due to a change in insurance, needs a referral placed.

## 2018-11-07 NOTE — Patient Instructions (Signed)
Schedule your complete physical at your convenience (insurance will require it) We'll put your referral in for ortho- don't forget the [facility] app! Call with any questions or concerns Happy New Year!!!

## 2018-11-21 MED FILL — CELECOXIB 200 MG CAP: 200 | 30 days supply | Qty: 30 | Fill #3

## 2018-11-23 ENCOUNTER — Telehealth: Payer: Self-pay | Admitting: Family Medicine

## 2018-11-23 NOTE — Telephone Encounter (Signed)
I have placed a preoperative eval., form up front in the bin with a chargesheet. Please fax to [facility] and call pt to let her know it has been taken care of.

## 2018-11-23 NOTE — Telephone Encounter (Signed)
Paperwork given to PCP for review.  

## 2018-11-24 ENCOUNTER — Encounter: Payer: Self-pay | Admitting: Family Medicine

## 2018-11-24 NOTE — Telephone Encounter (Signed)
Stacie Webster called patient and she has been scheduled for 11/30/17.

## 2018-11-24 NOTE — Telephone Encounter (Signed)
Pt will need surgical clearance appt.  She was notified via [place]

## 2018-11-26 IMAGING — MG 2D DIGITAL DIAGNOSTIC UNILATERAL RIGHT MAMMOGRAM WITH CAD AND AD
6 of 9 series · 6 of 21 positions shown · non-contrast
Comparison: 03/31/2017 and earlier

CLINICAL DATA: Patient returns after screening study for evaluation
of possible right breast asymmetry.

EXAM:
2D DIGITAL DIAGNOSTIC UNILATERAL RIGHT MAMMOGRAM WITH CAD AND
ADJUNCT TOMO

[R ML synth-2D]
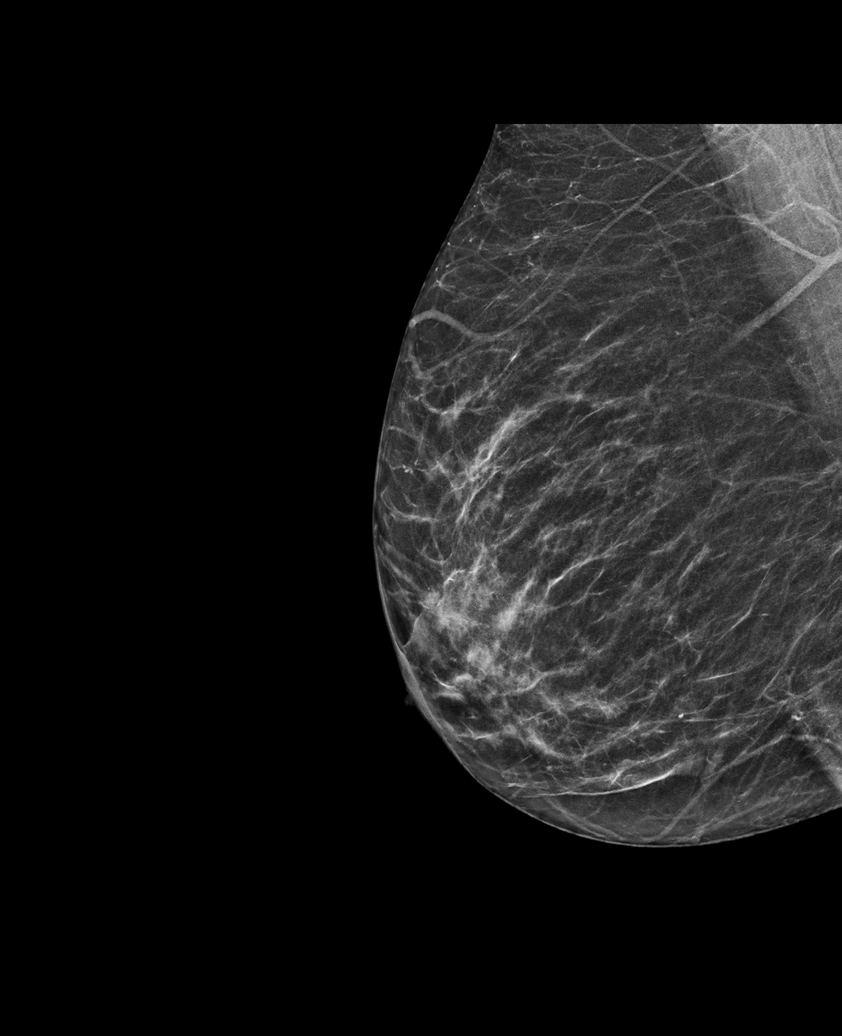

[R CC (1 of 2)]
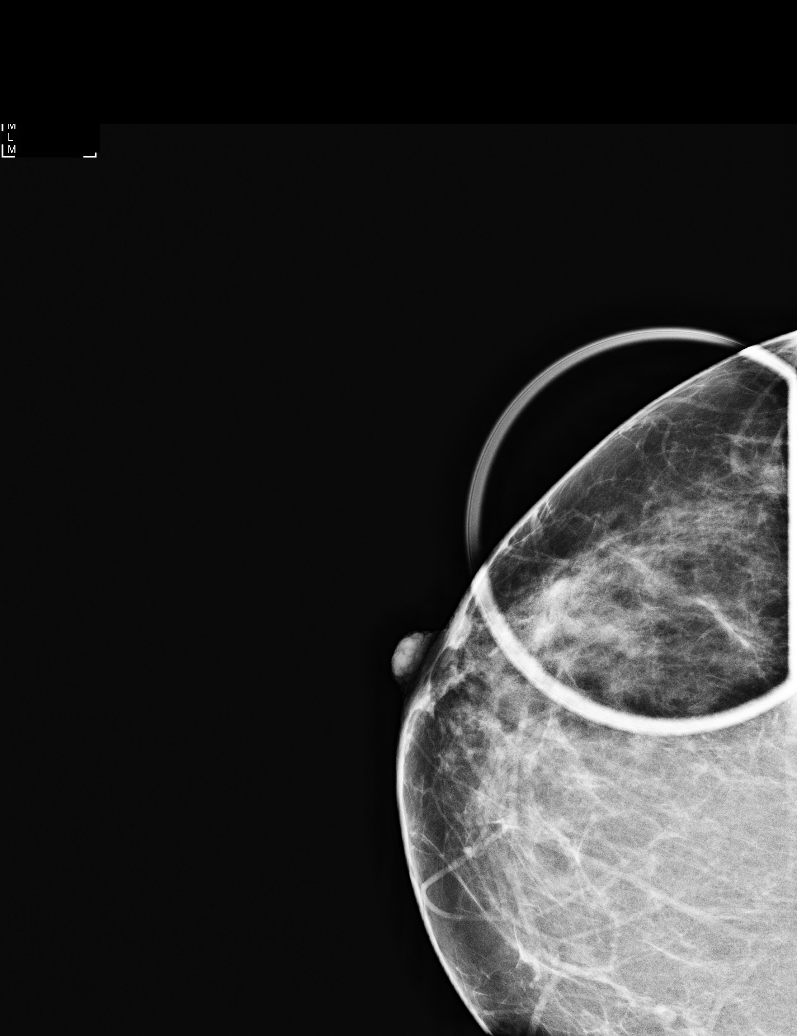

[R CC synth-2D (1 of 2)]
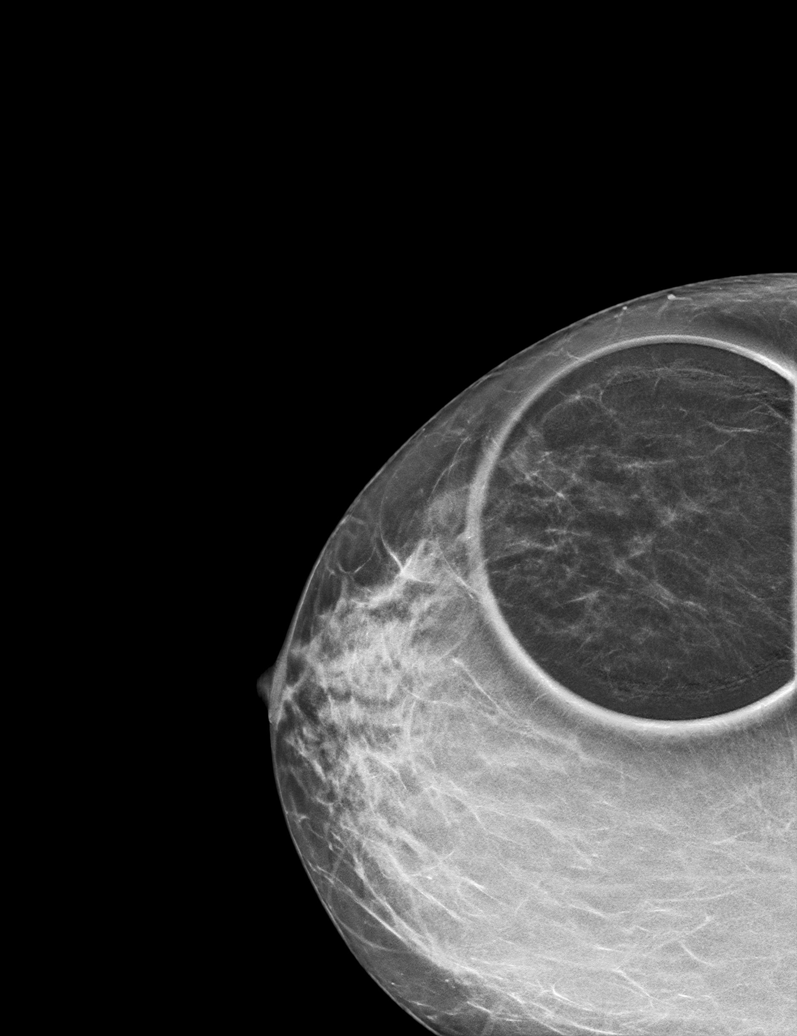

[R CC (2 of 2)]
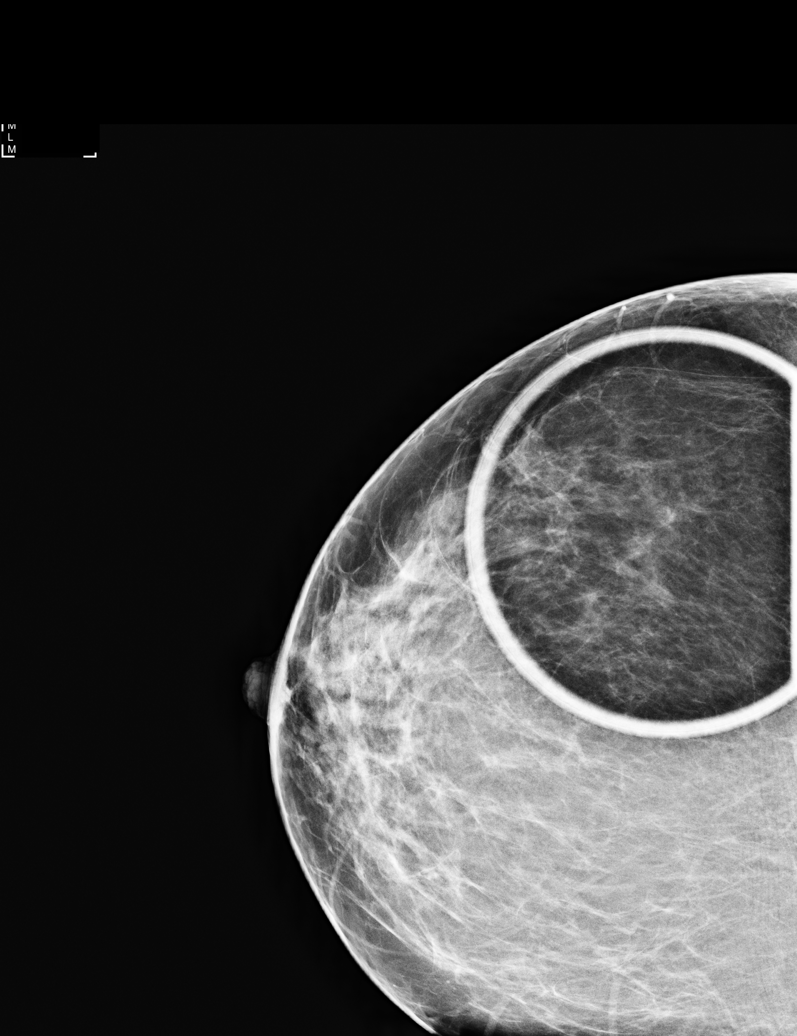

[R CC synth-2D (2 of 2)]
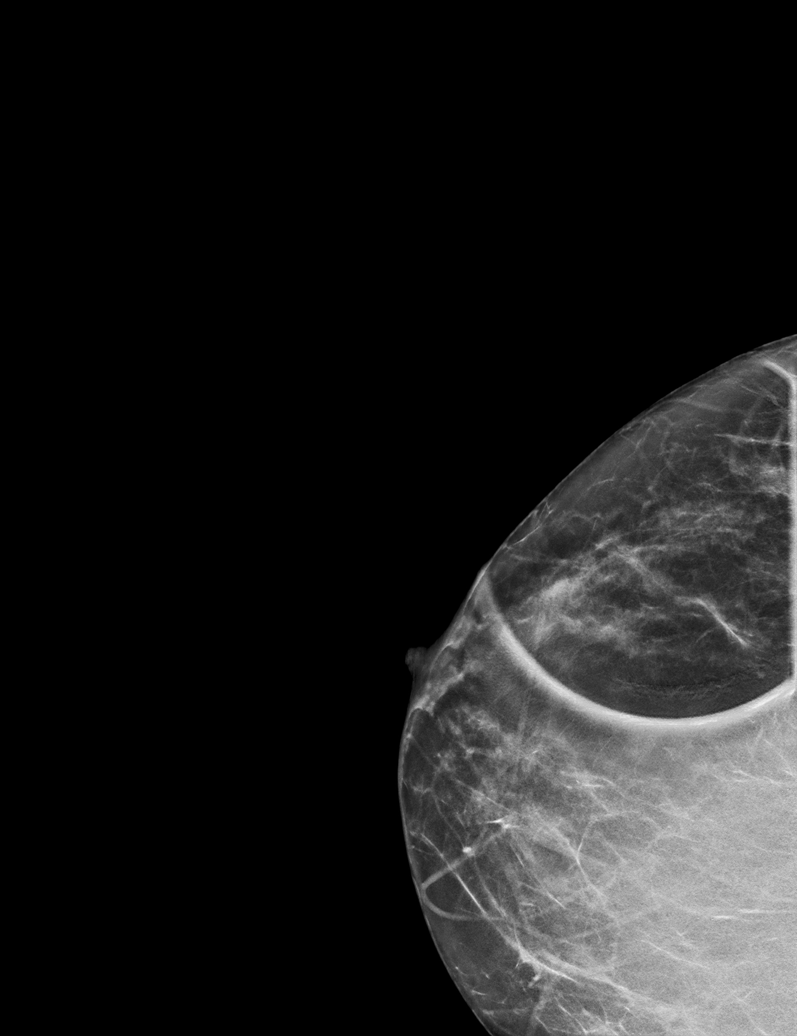

[R ML]
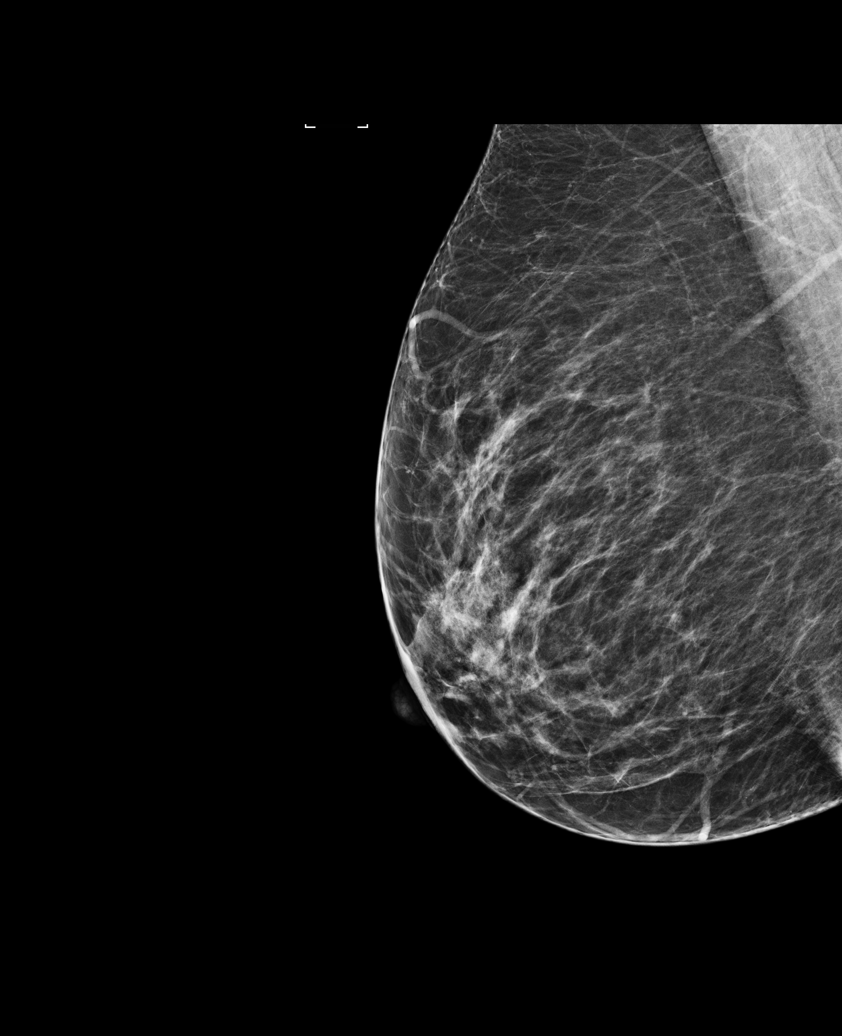

[6 of 21 positions shown; findings below may reference images not displayed]

ACR Breast Density Category b: There are scattered areas of
fibroglandular density.
FINDINGS: Additional 2-D and 3-D images are performed. No persistent
abnormality is identified in the lateral portion of the right
breast. No suspicious mass, distortion, or microcalcifications are
identified to suggest presence of malignancy.

Mammographic images were processed with CAD.
IMPRESSION: No mammographic evidence for malignancy.

RECOMMENDATION:
Screening mammogram in one year.(Code:CL-U-06F)

I have discussed the findings and recommendations with the patient.
Results were also provided in writing at the conclusion of the
visit. If applicable, a reminder letter will be sent to the patient
regarding the next appointment.

BI-RADS CATEGORY  1: Negative.

## 2018-11-30 ENCOUNTER — Encounter: Payer: Self-pay | Admitting: Family Medicine

## 2018-11-30 ENCOUNTER — Other Ambulatory Visit: Payer: Self-pay

## 2018-11-30 ENCOUNTER — Ambulatory Visit (INDEPENDENT_AMBULATORY_CARE_PROVIDER_SITE_OTHER): Payer: No Typology Code available for payment source | Admitting: Family Medicine

## 2018-11-30 VITALS — BP 121/80 | HR 77 | Temp 98.6°F | Resp 16 | Ht 66.0 in | Wt 239.2 lb

## 2018-11-30 DIAGNOSIS — E669 Obesity, unspecified: Secondary | ICD-10-CM | POA: Diagnosis not present

## 2018-11-30 DIAGNOSIS — Z0000 Encounter for general adult medical examination without abnormal findings: Secondary | ICD-10-CM | POA: Diagnosis not present

## 2018-11-30 DIAGNOSIS — Z01818 Encounter for other preprocedural examination: Secondary | ICD-10-CM | POA: Diagnosis not present

## 2018-11-30 LAB — HEPATIC FUNCTION PANEL
ALBUMIN: 4.7 g/dL (ref 3.5–5.2)
ALK PHOS: 43 U/L (ref 39–117)
ALT: 14 U/L (ref 0–35)
AST: 15 U/L (ref 0–37)
Bilirubin, Direct: 0.1 mg/dL (ref 0.0–0.3)
Total Bilirubin: 0.6 mg/dL (ref 0.2–1.2)
Total Protein: 7.4 g/dL (ref 6.0–8.3)

## 2018-11-30 LAB — CBC WITH DIFFERENTIAL/PLATELET
BASOS PCT: 1.2 % (ref 0.0–3.0)
Basophils Absolute: 0.1 10*3/uL (ref 0.0–0.1)
EOS ABS: 0.1 10*3/uL (ref 0.0–0.7)
Eosinophils Relative: 1.5 % (ref 0.0–5.0)
HCT: 40.2 % (ref 36.0–46.0)
Hemoglobin: 13.9 g/dL (ref 12.0–15.0)
LYMPHS ABS: 1.9 10*3/uL (ref 0.7–4.0)
Lymphocytes Relative: 25.3 % (ref 12.0–46.0)
MCHC: 34.6 g/dL (ref 30.0–36.0)
MCV: 91 fl (ref 78.0–100.0)
MONO ABS: 0.6 10*3/uL (ref 0.1–1.0)
Monocytes Relative: 7.6 % (ref 3.0–12.0)
NEUTROS ABS: 4.8 10*3/uL (ref 1.4–7.7)
Neutrophils Relative %: 64.4 % (ref 43.0–77.0)
PLATELETS: 434 10*3/uL — AB (ref 150.0–400.0)
RBC: 4.42 Mil/uL (ref 3.87–5.11)
RDW: 12.7 % (ref 11.5–15.5)
WBC: 7.5 10*3/uL (ref 4.0–10.5)

## 2018-11-30 LAB — LIPID PANEL
Cholesterol: 178 mg/dL (ref 0–200)
HDL: 64.1 mg/dL (ref >39.00)
LDL CALC: 95 mg/dL (ref 0–99)
NonHDL: 113.55
TRIGLYCERIDES: 92 mg/dL (ref 0.0–149.0)
Total CHOL/HDL Ratio: 3
VLDL: 18.4 mg/dL (ref 0.0–40.0)

## 2018-11-30 LAB — POCT URINALYSIS DIPSTICK
Bilirubin, UA: NEGATIVE
GLUCOSE UA: NEGATIVE
Ketones, UA: NEGATIVE
LEUKOCYTES UA: NEGATIVE
NITRITE UA: NEGATIVE
PROTEIN UA: NEGATIVE
Spec Grav, UA: 1.02 (ref 1.010–1.025)
Urobilinogen, UA: 0.2 E.U./dL
pH, UA: 5 (ref 5.0–8.0)

## 2018-11-30 LAB — BASIC METABOLIC PANEL
BUN: 13 mg/dL (ref 6–23)
CO2: 28 mEq/L (ref 19–32)
Calcium: 9.7 mg/dL (ref 8.4–10.5)
Chloride: 103 mEq/L (ref 96–112)
Creatinine, Ser: 0.7 mg/dL (ref 0.40–1.20)
GFR: 89.34 mL/min (ref >60.00)
Glucose, Bld: 88 mg/dL (ref 70–99)
POTASSIUM: 4.7 meq/L (ref 3.5–5.1)
Sodium: 139 mEq/L (ref 135–145)

## 2018-11-30 LAB — TSH: TSH: 1.74 u[IU]/mL (ref 0.35–4.50)

## 2018-11-30 LAB — HEMOGLOBIN A1C: HEMOGLOBIN A1C: 4.9 % (ref 4.6–6.5)

## 2018-11-30 NOTE — Patient Instructions (Signed)
Follow up in 1 year or as needed We'll notify you of your lab results and make any changes if needed We will fax your surgery packet as soon as the results are back Continue to work on healthy diet and exercise as you are able Call with any questions or concerns GOOD LUCK!

## 2018-11-30 NOTE — Progress Notes (Signed)
   Subjective:    Patient ID: Stacie Webster, female    DOB: 07-Mar-1971, 48 y.o.   MRN: [id number]  HPI CPE- UTD on mammo, pap, Tdap, flu.   Review of Systems Patient reports no vision/ hearing changes, adenopathy,fever, weight change,  persistant/recurrent hoarseness , swallowing issues, chest pain, palpitations, edema, persistant/recurrent cough, hemoptysis, dyspnea (rest/exertional/paroxysmal nocturnal), gastrointestinal bleeding (melena, rectal bleeding), abdominal pain, significant heartburn, bowel changes, GU symptoms (dysuria, hematuria, incontinence), Gyn symptoms (abnormal  bleeding, pain),  syncope, focal weakness, memory loss, numbness & tingling, skin/hair/nail changes, abnormal bruising or bleeding, anxiety, or depression.     Objective:   Physical Exam General Appearance:    Alert, cooperative, no distress, appears stated age, obese  Head:    Normocephalic, without obvious abnormality, atraumatic  Eyes:    PERRL, conjunctiva/corneas clear, EOM's intact, fundi    benign, both eyes  Ears:    Normal TM's and external ear canals, both ears  Nose:   Nares normal, septum midline, mucosa normal, no drainage    or sinus tenderness  Throat:   Lips, mucosa, and tongue normal; teeth and gums normal  Neck:   Supple, symmetrical, trachea midline, no adenopathy;    Thyroid: no enlargement/tenderness/nodules  Back:     Symmetric, no curvature, ROM normal, no CVA tenderness  Lungs:     Clear to auscultation bilaterally, respirations unlabored  Chest Wall:    No tenderness or deformity   Heart:    Regular rate and rhythm, S1 and S2 normal, no murmur, rub   or gallop  Breast Exam:    Deferred to GYN  Abdomen:     Soft, non-tender, bowel sounds active all four quadrants,    no masses, no organomegaly  Genitalia:    Deferred to GYN  Rectal:    Extremities:   Extremities normal, atraumatic, no cyanosis or edema  Pulses:   2+ and symmetric all extremities  Skin:   Skin color, texture,  turgor normal, no rashes or lesions  Lymph nodes:   Cervical, supraclavicular, and axillary nodes normal  Neurologic:   CNII-XII intact, normal strength, sensation and reflexes    throughout          Assessment & Plan:

## 2018-11-30 NOTE — Assessment & Plan Note (Signed)
Check labs to risk stratify.  Encouraged healthy diet and regular exercise.  Will follow.

## 2018-11-30 NOTE — Assessment & Plan Note (Signed)
Pt's PE WNL w/ exception of obesity.  UTD on GYN, immunizations.  Check labs.  Anticipatory guidance provided.  °

## 2018-12-07 ENCOUNTER — Ambulatory Visit: Payer: Self-pay | Admitting: Orthopedic Surgery

## 2019-01-02 ENCOUNTER — Ambulatory Visit: Payer: Self-pay | Admitting: Orthopedic Surgery

## 2019-01-02 MED FILL — ASPIRIN EC 325 MG TABLET: 325 | 30 days supply | Qty: 60 | Fill #0

## 2019-01-02 MED FILL — oxyCODONE HCL 5 MG TABS: 5 | 6 days supply | Qty: 40 | Fill #0

## 2019-01-02 MED FILL — METHOCARBAMOL 500 MG TABLET: 500 | 13 days supply | Qty: 50 | Fill #0

## 2019-01-02 NOTE — H&P (Signed)
TOTAL KNEE ADMISSION H&P  Patient is being admitted for right total knee arthroplasty.  Subjective:  Chief Complaint:right knee pain.  HPI: Stacie Webster, 48 y.o. female, has a history of pain and functional disability in the right knee due to arthritis and has failed non-surgical conservative treatments for greater than 12 weeks to includecorticosteriod injections, use of assistive devices and activity modification.  Onset of symptoms was gradual, starting 2 years ago with gradually worsening course since that time. The patient noted no past surgery on the right knee(s).  Patient currently rates pain in the right knee(s) at 7 out of 10 with activity. Patient has worsening of pain with activity and weight bearing, pain that interferes with activities of daily living, pain with passive range of motion and joint swelling.  Patient has evidence of subchondral sclerosis, periarticular osteophytes and joint space narrowing by imaging studies. There is no active infection.  Patient Active Problem List   Diagnosis Date Noted  . Physical exam 11/30/2018  . Obesity (BMI 35.0-39.9 without comorbidity) 11/07/2018  . Family history of colon cancer paternal grandmother and great grandmother and father w/ polyps 03/09/2018  . Allergic rhinitis 12/19/2012  . ANISOCORIA 03/26/2009  . NYSTAGMUS 03/26/2009  . BACK PAIN 01/23/2009   Past Medical History:  Diagnosis Date  . Allergy   . Arthritis    knees    Past Surgical History:  Procedure Laterality Date  . CARPAL TUNNEL RELEASE Right   . KNEE SURGERY Bilateral     Current Facility-Administered Medications  Medication Dose Route Frequency Provider Last Rate Last Dose  . 0.9 %  sodium chloride infusion  500 mL Intravenous Once Gatha Mayer, MD       Current Outpatient Medications  Medication Sig Dispense Refill Last Dose  . celecoxib (CELEBREX) 200 MG capsule    Taking  . traMADol (ULTRAM) 50 MG tablet tramadol 50 mg tablet   Taking    Allergies  Allergen Reactions  . Penicillins Hives and Shortness Of Breath  . Ciprofloxacin Other (See Comments)    Severe muscle weakness  . Meloxicam Other (See Comments)    Mouth ulcers  . Quinolones Other (See Comments)    Severe muscle weakness    Social History   Tobacco Use  . Smoking status: Never Smoker  . Smokeless tobacco: Never Used  Substance Use Topics  . Alcohol use: No    Family History  Problem Relation Age of Onset  . Colon polyps Father   . Colon cancer Paternal Grandmother   . Colon cancer Other        paternal great grandmother  . Esophageal cancer Neg Hx   . Rectal cancer Neg Hx   . Stomach cancer Neg Hx      Review of Systems  Constitutional: Negative.   HENT: Negative.   Eyes: Negative.   Respiratory: Negative.   Cardiovascular: Negative.   Gastrointestinal: Negative.   Genitourinary: Negative.   Musculoskeletal: Positive for joint pain.  Skin: Negative.   Neurological: Negative.   Endo/Heme/Allergies: Negative.   Psychiatric/Behavioral: Negative.     Objective:  Physical Exam  Constitutional: She is oriented to person, place, and time. She appears well-developed.  HENT:  Head: Normocephalic.  Eyes: EOM are normal.  Neck: Normal range of motion.  Cardiovascular: Normal rate and intact distal pulses.  Respiratory: Effort normal and breath sounds normal.  GI: Soft.  Genitourinary:    Genitourinary Comments: Deferred   Musculoskeletal:     Comments: Limited knee  ROM. Calf is soft. Knee is stable at varus and valgus stress.  Neurological: She is alert and oriented to person, place, and time.  Skin: Skin is warm and dry.  Psychiatric: Her behavior is normal.    Vital signs in last 24 hours:    Labs:   Estimated body mass index is 38.62 kg/m as calculated from the following:   Height as of 11/30/18: 5\' 6"  (1.676 m).   Weight as of 11/30/18: 108.5 kg.   Imaging Review Plain radiographs demonstrate moderate degenerative  joint disease of the right knee(s). The overall alignment ismild varus. The bone quality appears to be good for age and reported activity level.      Assessment/Plan:  End stage arthritis, right knee   The patient history, physical examination, clinical judgment of the provider and imaging studies are consistent with end stage degenerative joint disease of the right knee(s) and total knee arthroplasty is deemed medically necessary. The treatment options including medical management, injection therapy arthroscopy and arthroplasty were discussed at length. The risks and benefits of total knee arthroplasty were presented and reviewed. The risks due to aseptic loosening, infection, stiffness, patella tracking problems, thromboembolic complications and other imponderables were discussed. The patient acknowledged the explanation, agreed to proceed with the plan and consent was signed. Patient is being admitted for inpatient treatment for surgery, pain control, PT, OT, prophylactic antibiotics, VTE prophylaxis, progressive ambulation and ADL's and discharge planning. The patient is planning to be discharged home with home health services    Anticipated LOS equal to or greater than 2 midnights due to - Age 51 and older with one or more of the following:  - Obesity  - Expected need for hospital services (PT, OT, Nursing) required for safe  discharge  - Anticipated need for postoperative skilled nursing care or inpatient rehab  - Active co-morbidities: None OR   - Unanticipated findings during/Post Surgery: None  - Patient is a high risk of re-admission due to: None   Will use IV tranexamic acid. Contraindications and adverse affects of Tranexamic acid discussed in detail. Patient denies any of these at this time and understands the risks and benefits.

## 2019-01-06 NOTE — Patient Instructions (Addendum)
Stacie Webster  01/06/2019   Your procedure is scheduled on: 01-13-19    Report to [facility] Main  Entrance    Report to [place] at 5:30 AM    Call this number if you have problems the morning of surgery [phone]    Remember: Do not eat food or drink liquids :After Midnight.    BRUSH YOUR TEETH MORNING OF SURGERY AND RINSE YOUR MOUTH OUT, NO CHEWING GUM CANDY OR MINTS.     Take these medicines the morning of surgery with A SIP OF WATER: None                                You may not have any metal on your body including hair pins and              piercings  Do not wear jewelry, make-up, lotions, powders or perfumes, deodorant           Donot wear nail polish. Do not shave 48 hours prior to surgery.    Do not bring valuables to the hospital. [name].  Contacts, dentures or bridgework may not be worn into surgery.  Leave suitcase in the car. After surgery it may be brought to your room.     Patients discharged the day of surgery will not be allowed to drive home. IF YOU ARE HAVING SURGERY AND GOING HOME THE SAME DAY, YOU MUST HAVE AN ADULT TO DRIVE YOU HOME AND BE WITH YOU FOR 24 HOURS. YOU MAY GO HOME BY TAXI OR UBER OR ORTHERWISE, BUT AN ADULT MUST ACCOMPANY YOU HOME AND STAY WITH YOU FOR 24 HOURS.    Special Instructions: N/A              Please read over the following fact sheets you were given: _____________________________________________________________________             [facility] - Preparing for Surgery Before surgery, you can play an important role.  Because skin is not sterile, your skin needs to be as free of germs as possible.  You can reduce the number of germs on your skin by washing with CHG (chlorahexidine gluconate) soap before surgery.  CHG is an antiseptic cleaner which kills germs and bonds with the skin to continue killing germs even after washing. Please DO NOT use  if you have an allergy to CHG or antibacterial soaps.  If your skin becomes reddened/irritated stop using the CHG and inform your nurse when you arrive at Short Stay. Do not shave (including legs and underarms) for at least 48 hours prior to the first CHG shower.  You may shave your face/neck. Please follow these instructions carefully:  1.  Shower with CHG Soap the night before surgery and the  morning of Surgery.  2.  If you choose to wash your hair, wash your hair first as usual with your  normal  shampoo.  3.  After you shampoo, rinse your hair and body thoroughly to remove the  shampoo.                           4.  Use CHG as you would any other liquid soap.  You can apply chg directly  to the skin and wash                       Gently with a scrungie or clean washcloth.  5.  Apply the CHG Soap to your body ONLY FROM THE NECK DOWN.   Do not use on face/ open                           Wound or open sores. Avoid contact with eyes, ears mouth and genitals (private parts).                       Wash face,  Genitals (private parts) with your normal soap.             6.  Wash thoroughly, paying special attention to the area where your surgery  will be performed.  7.  Thoroughly rinse your body with warm water from the neck down.  8.  DO NOT shower/wash with your normal soap after using and rinsing off  the CHG Soap.                9.  Pat yourself dry with a clean towel.            10.  Wear clean pajamas.            11.  Place clean sheets on your bed the night of your first shower and do not  sleep with pets. Day of Surgery : Do not apply any lotions/deodorants the morning of surgery.  Please wear clean clothes to the hospital/surgery center.  FAILURE TO FOLLOW THESE INSTRUCTIONS MAY RESULT IN THE CANCELLATION OF YOUR SURGERY PATIENT SIGNATURE_________________________________  NURSE  SIGNATURE__________________________________  ________________________________________________________________________   Stacie Webster  An incentive spirometer is a tool that can help keep your lungs clear and active. This tool measures how well you are filling your lungs with each breath. Taking long deep breaths may help reverse or decrease the chance of developing breathing (pulmonary) problems (especially infection) following:  A long period of time when you are unable to move or be active. BEFORE THE PROCEDURE   If the spirometer includes an indicator to show your best effort, your nurse or respiratory therapist will set it to a desired goal.  If possible, sit up straight or lean slightly forward. Try not to slouch.  Hold the incentive spirometer in an upright position. INSTRUCTIONS FOR USE  1. Sit on the edge of your bed if possible, or sit up as far as you can in bed or on a chair. 2. Hold the incentive spirometer in an upright position. 3. Breathe out normally. 4. Place the mouthpiece in your mouth and seal your lips tightly around it. 5. Breathe in slowly and as deeply as possible, raising the piston or the ball toward the top of the column. 6. Hold your breath for 3-5 seconds or for as long as possible. Allow the piston or ball to fall to the bottom of the column. 7. Remove the mouthpiece from your mouth and breathe out normally. 8. Rest for a few seconds and repeat Steps 1 through 7 at least 10 times every 1-2 hours when you are awake. Take your time and take a few normal breaths between deep breaths. 9. The spirometer may include an indicator to show your best effort. Use the indicator as a goal to work toward  during each repetition. 10. After each set of 10 deep breaths, practice coughing to be sure your lungs are clear. If you have an incision (the cut made at the time of surgery), support your incision when coughing by placing a pillow or rolled up towels firmly  against it. Once you are able to get out of bed, walk around indoors and cough well. You may stop using the incentive spirometer when instructed by your caregiver.  RISKS AND COMPLICATIONS  Take your time so you do not get dizzy or light-headed.  If you are in pain, you may need to take or ask for pain medication before doing incentive spirometry. It is harder to take a deep breath if you are having pain. AFTER USE  Rest and breathe slowly and easily.  It can be helpful to keep track of a log of your progress. Your caregiver can provide you with a simple table to help with this. If you are using the spirometer at home, follow these instructions: [place] IF:   You are having difficultly using the spirometer.  You have trouble using the spirometer as often as instructed.  Your pain medication is not giving enough relief while using the spirometer.  You develop fever of 100.5 F (38.1 C) or higher. SEEK IMMEDIATE MEDICAL CARE IF:   You cough up bloody sputum that had not been present before.  You develop fever of 102 F (38.9 C) or greater.  You develop worsening pain at or near the incision site. MAKE SURE YOU:   Understand these instructions.  Will watch your condition.  Will get help right away if you are not doing well or get worse. Document Released: 03/01/2007 Document Revised: 01/11/2012 Document Reviewed: 05/02/2007 ExitCare Patient Information 2014 ExitCare, [place].   ________________________________________________________________________  WHAT IS A BLOOD TRANSFUSION? Blood Transfusion Information  A transfusion is the replacement of blood or some of its parts. Blood is made up of multiple cells which provide different functions.  Red blood cells carry oxygen and are used for blood loss replacement.  White blood cells fight against infection.  Platelets control bleeding.  Plasma helps clot blood.  Other blood products are available for  specialized needs, such as hemophilia or other clotting disorders. BEFORE THE TRANSFUSION  Who gives blood for transfusions?   Healthy volunteers who are fully evaluated to make sure their blood is safe. This is blood bank blood. Transfusion therapy is the safest it has ever been in the practice of medicine. Before blood is taken from a donor, a complete history is taken to make sure that person has no history of diseases nor engages in risky social behavior (examples are intravenous drug use or sexual activity with multiple partners). The donor's travel history is screened to minimize risk of transmitting infections, such as malaria. The donated blood is tested for signs of infectious diseases, such as HIV and hepatitis. The blood is then tested to be sure it is compatible with you in order to minimize the chance of a transfusion reaction. If you or a relative donates blood, this is often done in anticipation of surgery and is not appropriate for emergency situations. It takes many days to process the donated blood. RISKS AND COMPLICATIONS Although transfusion therapy is very safe and saves many lives, the main dangers of transfusion include:   Getting an infectious disease.  Developing a transfusion reaction. This is an allergic reaction to something in the blood you were given. Every precaution is taken to  prevent this. The decision to have a blood transfusion has been considered carefully by your caregiver before blood is given. Blood is not given unless the benefits outweigh the risks. AFTER THE TRANSFUSION  Right after receiving a blood transfusion, you will usually feel much better and more energetic. This is especially true if your red blood cells have gotten low (anemic). The transfusion raises the level of the red blood cells which carry oxygen, and this usually causes an energy increase.  The nurse administering the transfusion will monitor you carefully for complications. HOME CARE  INSTRUCTIONS  No special instructions are needed after a transfusion. You may find your energy is better. Speak with your caregiver about any limitations on activity for underlying diseases you may have. SEEK MEDICAL CARE IF:   Your condition is not improving after your transfusion.  You develop redness or irritation at the intravenous (IV) site. SEEK IMMEDIATE MEDICAL CARE IF:  Any of the following symptoms occur over the next 12 hours:  Shaking chills.  You have a temperature by mouth above 102 F (38.9 C), not controlled by medicine.  Chest, back, or muscle pain.  People around you feel you are not acting correctly or are confused.  Shortness of breath or difficulty breathing.  Dizziness and fainting.  You get a rash or develop hives.  You have a decrease in urine output.  Your urine turns a dark color or changes to pink, red, or brown. Any of the following symptoms occur over the next 10 days:  You have a temperature by mouth above 102 F (38.9 C), not controlled by medicine.  Shortness of breath.  Weakness after normal activity.  The white part of the eye turns yellow (jaundice).  You have a decrease in the amount of urine or are urinating less often.  Your urine turns a dark color or changes to pink, red, or brown. Document Released: 10/16/2000 Document Revised: 01/11/2012 Document Reviewed: 06/04/2008 [facility] Patient Information 2014 [place], [place].  _______________________________________________________________________

## 2019-01-06 NOTE — Progress Notes (Signed)
11-30-18 Surgical Clearance on chart from Dr. Birdie Riddle  11-30-18 (Epic) EKG, [facility]

## 2019-01-09 ENCOUNTER — Encounter (HOSPITAL_COMMUNITY)
Admission: RE | Admit: 2019-01-09 | Discharge: 2019-01-09 | Disposition: A | Discharge status: Home / Self Care | Payer: No Typology Code available for payment source | Source: Ambulatory Visit | Attending: Specialist | Admitting: Specialist

## 2019-01-09 ENCOUNTER — Encounter (HOSPITAL_COMMUNITY): Payer: Self-pay

## 2019-01-09 ENCOUNTER — Other Ambulatory Visit: Payer: Self-pay

## 2019-01-09 DIAGNOSIS — Z01812 Encounter for preprocedural laboratory examination: Secondary | ICD-10-CM | POA: Insufficient documentation

## 2019-01-09 HISTORY — DX: Adverse effect of unspecified anesthetic, initial encounter: T41.45XA

## 2019-01-09 HISTORY — DX: Nausea with vomiting, unspecified: R11.2

## 2019-01-09 HISTORY — DX: Other complications of anesthesia, initial encounter: T88.59XA

## 2019-01-09 HISTORY — DX: Other specified postprocedural states: Z98.890

## 2019-01-09 LAB — SURGICAL PCR SCREEN
MRSA, PCR: NEGATIVE
Staphylococcus aureus: NEGATIVE

## 2019-01-09 LAB — CBC
HCT: 41.7 % (ref 36.0–46.0)
Hemoglobin: 13.5 g/dL (ref 12.0–15.0)
MCH: 30.3 pg (ref 26.0–34.0)
MCHC: 32.4 g/dL (ref 30.0–36.0)
MCV: 93.7 fL (ref 80.0–100.0)
Platelets: 402 10*3/uL — ABNORMAL HIGH (ref 150–400)
RBC: 4.45 MIL/uL (ref 3.87–5.11)
RDW: 12 % (ref 11.5–15.5)
WBC: 8.3 10*3/uL (ref 4.0–10.5)
nRBC: 0 % (ref 0.0–0.2)

## 2019-01-09 LAB — URINALYSIS, ROUTINE W REFLEX MICROSCOPIC
Bilirubin Urine: NEGATIVE
Glucose, UA: NEGATIVE mg/dL
Ketones, ur: NEGATIVE mg/dL
Nitrite: NEGATIVE
PH: 5 (ref 5.0–8.0)
Protein, ur: NEGATIVE mg/dL
Specific Gravity, Urine: 1.024 (ref 1.005–1.030)

## 2019-01-09 LAB — PROTIME-INR
INR: 1 (ref 0.8–1.2)
Prothrombin Time: 13.1 seconds (ref 11.4–15.2)

## 2019-01-09 LAB — APTT: aPTT: 28 seconds (ref 24–36)

## 2019-01-09 LAB — ABO/RH: ABO/RH(D): A POS

## 2019-01-09 LAB — PREGNANCY, URINE: Preg Test, Ur: NEGATIVE

## 2019-01-09 NOTE — Progress Notes (Signed)
Lab called to notify staff that pt's BMP hemolyzed. Order placed for BMP to be redrawn the day of surgery.

## 2019-01-09 NOTE — Pre-Procedure Instructions (Signed)
UA results 01/09/2019 sent to Dr. Lyla Glassing via epic.

## 2019-01-10 ENCOUNTER — Other Ambulatory Visit: Payer: Self-pay | Admitting: *Deleted

## 2019-01-10 NOTE — Patient Outreach (Signed)
[facility] [facility]) Care Management  01/10/2019  Stacie Webster 1971-09-06 [id number]   Preoperative Screening Call Referral received: 12/27/18 Surgery/procedure date: 01/13/19 Insurance: [place]  Subjective:  Initial successful telephone call to patient's preferred number in order to complete preoperative screening. 2 HIPAA identifiers verified. Discussed purpose of preoperative call. Patient voices understanding and agrees to call. She states she understands the reasons for the surgery and the expected time of arrival. She says she completed her pre-op testing on 01/09/19 and has no additional questions. She says she expects to be in the hospital 1-2 days.  She states she has completed medical leave paperwork and she is not sure if she has the hospital indemnity benefit so she says she will check her benefits. She is aware she will have to file the claim after her surgery if she does have the benefit. She says she will have 24/7 care at home provided by her husband to assist in her recovery. She does not have and is not interested in completing advanced directives at this time. She agrees to a post hospital discharge transition of care call.    Objective:  Per chart review, Stacie Webster is scheduled for right total knee arthroplasty on 01/13/19 at [facility].  Assessment: Preoperative call completed, no preoperative needs identified.   Plan: Assisted Stacie Webster with identifying her health action step and ensuring she has completed her health risk assessment and had an annual wellness exam in order to qualify for the [place] healthy lifestyle premium discount in 2021.  RNCM will call patient for transition of care outreach within 72 hours of hospital discharge notification.  Barrington Ellison RN,CCM,CDE [place] Management Coordinator Office Phone [phone] Office Fax [phone]

## 2019-01-11 ENCOUNTER — Encounter: Payer: Self-pay | Admitting: Family Medicine

## 2019-01-11 DIAGNOSIS — L989 Disorder of the skin and subcutaneous tissue, unspecified: Secondary | ICD-10-CM

## 2019-01-12 MED ORDER — BUPIVACAINE LIPOSOME 1.3 % IJ SUSP
20.0000 mL | Freq: Once | INTRAMUSCULAR | Status: DC
  Filled 2019-01-12: qty 20

## 2019-01-12 NOTE — Anesthesia Preprocedure Evaluation (Addendum)
Anesthesia Evaluation    Reviewed: Allergy & Precautions, H&P , NPO status , Patient's Chart, lab work & pertinent test results  History of Anesthesia Complications (+) PONV and history of anesthetic complications  Airway Mallampati: II  TM Distance: >3 FB Neck ROM: Full    Dental no notable dental hx. (+) Teeth Intact, Dental Advisory Given   Pulmonary neg pulmonary ROS,    Pulmonary exam normal breath sounds clear to auscultation       Cardiovascular Exercise Tolerance: Good negative cardio ROS Normal cardiovascular exam Rhythm:Regular Rate:Normal     Neuro/Psych negative neurological ROS  negative psych ROS   GI/Hepatic negative GI ROS, Neg liver ROS,   Endo/Other  Morbid obesity  Renal/GU negative Renal ROS  negative genitourinary   Musculoskeletal  (+) Arthritis , Osteoarthritis,    Abdominal   Peds  Hematology negative hematology ROS (+)   Anesthesia Other Findings   Reproductive/Obstetrics negative OB ROS                            Anesthesia Physical Anesthesia Plan  ASA: II  Anesthesia Plan: Spinal   Post-op Pain Management:  Regional for Post-op pain   Induction:   PONV Risk Score and Plan: 4 or greater and Ondansetron, Dexamethasone, Treatment may vary due to age or medical condition, Midazolam and Scopolamine patch - Pre-op  Airway Management Planned: Nasal Cannula  Additional Equipment:   Intra-op Plan:   Post-operative Plan:   Informed Consent: I have reviewed the patients History and Physical, chart, labs and discussed the procedure including the risks, benefits and alternatives for the proposed anesthesia with the patient or authorized representative who has indicated his/her understanding and acceptance.       Plan Discussed with:   Anesthesia Plan Comments: (  )        Anesthesia Quick Evaluation

## 2019-01-13 ENCOUNTER — Encounter (HOSPITAL_COMMUNITY)
Admission: RE | Disposition: A | Discharge status: Home / Self Care | Payer: Self-pay | Source: Home / Self Care | Attending: Specialist

## 2019-01-13 ENCOUNTER — Inpatient Hospital Stay (HOSPITAL_COMMUNITY): Payer: No Typology Code available for payment source | Admitting: Anesthesiology

## 2019-01-13 ENCOUNTER — Other Ambulatory Visit: Payer: Self-pay

## 2019-01-13 ENCOUNTER — Observation Stay (HOSPITAL_COMMUNITY)
Admission: RE | Admit: 2019-01-13 | Discharge: 2019-01-15 | Disposition: A | Discharge status: Home / Self Care | Payer: No Typology Code available for payment source | Attending: Specialist | Admitting: Specialist

## 2019-01-13 ENCOUNTER — Inpatient Hospital Stay (HOSPITAL_COMMUNITY): Payer: No Typology Code available for payment source | Admitting: Physician Assistant

## 2019-01-13 ENCOUNTER — Encounter (HOSPITAL_COMMUNITY): Payer: Self-pay | Admitting: Emergency Medicine

## 2019-01-13 DIAGNOSIS — M1711 Unilateral primary osteoarthritis, right knee: Secondary | ICD-10-CM | POA: Diagnosis not present

## 2019-01-13 DIAGNOSIS — Z888 Allergy status to other drugs, medicaments and biological substances status: Secondary | ICD-10-CM | POA: Diagnosis not present

## 2019-01-13 DIAGNOSIS — Z96659 Presence of unspecified artificial knee joint: Secondary | ICD-10-CM

## 2019-01-13 DIAGNOSIS — Z881 Allergy status to other antibiotic agents status: Secondary | ICD-10-CM | POA: Insufficient documentation

## 2019-01-13 DIAGNOSIS — Z79891 Long term (current) use of opiate analgesic: Secondary | ICD-10-CM | POA: Diagnosis not present

## 2019-01-13 DIAGNOSIS — Z6838 Body mass index (BMI) 38.0-38.9, adult: Secondary | ICD-10-CM | POA: Diagnosis not present

## 2019-01-13 DIAGNOSIS — Z880 Allergy status to penicillin: Secondary | ICD-10-CM | POA: Insufficient documentation

## 2019-01-13 DIAGNOSIS — R112 Nausea with vomiting, unspecified: Secondary | ICD-10-CM | POA: Diagnosis not present

## 2019-01-13 DIAGNOSIS — Z791 Long term (current) use of non-steroidal anti-inflammatories (NSAID): Secondary | ICD-10-CM | POA: Insufficient documentation

## 2019-01-13 HISTORY — PX: TOTAL KNEE ARTHROPLASTY: SHX125

## 2019-01-13 LAB — TYPE AND SCREEN
ABO/RH(D): A POS
Antibody Screen: NEGATIVE

## 2019-01-13 SURGERY — ARTHROPLASTY, KNEE, TOTAL
Anesthesia: Spinal | Laterality: Right

## 2019-01-13 MED ORDER — ONDANSETRON HCL 4 MG/2ML IJ SOLN: 4.0000 mg | Freq: Once | INTRAMUSCULAR | Status: DC | PRN

## 2019-01-13 MED ORDER — PROPOFOL 500 MG/50ML IV EMUL
INTRAVENOUS | Status: DC | PRN
  Administered 2019-01-13: 125 ug/kg/min via INTRAVENOUS

## 2019-01-13 MED ORDER — FENTANYL CITRATE (PF) 100 MCG/2ML IJ SOLN
INTRAMUSCULAR | Status: AC
  Filled 2019-01-13: qty 2

## 2019-01-13 MED ORDER — HYDROMORPHONE HCL 1 MG/ML IJ SOLN
0.5000 mg | INTRAMUSCULAR | Status: DC | PRN
  Administered 2019-01-13 – 2019-01-14 (×2): 1 mg via INTRAVENOUS
  Filled 2019-01-13 (×2): qty 1

## 2019-01-13 MED ORDER — 0.9 % SODIUM CHLORIDE (POUR BTL) OPTIME
TOPICAL | Status: DC | PRN
  Administered 2019-01-13: 1000 mL

## 2019-01-13 MED ORDER — METHOCARBAMOL 500 MG PO TABS: 500.0000 mg | ORAL_TABLET | Freq: Three times a day (TID) | ORAL | 1 refills | Status: DC | PRN

## 2019-01-13 MED ORDER — TRANEXAMIC ACID-NACL 1000-0.7 MG/100ML-% IV SOLN
1000.0000 mg | INTRAVENOUS | Status: AC
  Administered 2019-01-13: 1000 mg via INTRAVENOUS
  Filled 2019-01-13: qty 100

## 2019-01-13 MED ORDER — SODIUM CHLORIDE (PF) 0.9 % IJ SOLN
INTRAMUSCULAR | Status: AC
  Filled 2019-01-13: qty 10

## 2019-01-13 MED ORDER — DEXAMETHASONE SODIUM PHOSPHATE 10 MG/ML IJ SOLN
10.0000 mg | Freq: Once | INTRAMUSCULAR | Status: AC
  Administered 2019-01-13: 10 mg via INTRAVENOUS

## 2019-01-13 MED ORDER — DEXAMETHASONE SODIUM PHOSPHATE 10 MG/ML IJ SOLN
INTRAMUSCULAR | Status: AC
  Filled 2019-01-13: qty 1

## 2019-01-13 MED ORDER — SODIUM CHLORIDE 0.9 % IV SOLN
INTRAVENOUS | Status: DC
  Administered 2019-01-13 – 2019-01-14 (×2): via INTRAVENOUS

## 2019-01-13 MED ORDER — ONDANSETRON HCL 4 MG/2ML IJ SOLN
INTRAMUSCULAR | Status: DC | PRN
  Administered 2019-01-13: 4 mg via INTRAVENOUS

## 2019-01-13 MED ORDER — BUPIVACAINE IN DEXTROSE 0.75-8.25 % IT SOLN
INTRATHECAL | Status: DC | PRN
  Administered 2019-01-13: 1.6 mL via INTRATHECAL

## 2019-01-13 MED ORDER — DIPHENHYDRAMINE HCL 12.5 MG/5ML PO ELIX: 12.5000 mg | ORAL_SOLUTION | ORAL | Status: DC | PRN

## 2019-01-13 MED ORDER — SCOPOLAMINE 1 MG/3DAYS TD PT72
MEDICATED_PATCH | TRANSDERMAL | Status: AC
  Filled 2019-01-13: qty 1

## 2019-01-13 MED ORDER — METHOCARBAMOL 500 MG IVPB - SIMPLE MED
INTRAVENOUS | Status: AC
  Filled 2019-01-13: qty 50

## 2019-01-13 MED ORDER — MAGNESIUM CITRATE PO SOLN: 1.0000 | Freq: Once | ORAL | Status: DC | PRN

## 2019-01-13 MED ORDER — FENTANYL CITRATE (PF) 100 MCG/2ML IJ SOLN
25.0000 ug | INTRAMUSCULAR | Status: DC | PRN
  Administered 2019-01-13: 50 ug via INTRAVENOUS

## 2019-01-13 MED ORDER — DEXAMETHASONE SODIUM PHOSPHATE 10 MG/ML IJ SOLN
10.0000 mg | Freq: Once | INTRAMUSCULAR | Status: AC
  Administered 2019-01-14: 10 mg via INTRAVENOUS
  Filled 2019-01-13: qty 1

## 2019-01-13 MED ORDER — MEPERIDINE HCL 50 MG/ML IJ SOLN: 6.2500 mg | INTRAMUSCULAR | Status: DC | PRN

## 2019-01-13 MED ORDER — ACETAMINOPHEN 325 MG PO TABS: 325.0000 mg | ORAL_TABLET | ORAL | Status: DC | PRN

## 2019-01-13 MED ORDER — PROPOFOL 10 MG/ML IV BOLUS
INTRAVENOUS | Status: AC
  Filled 2019-01-13: qty 60

## 2019-01-13 MED ORDER — OXYCODONE HCL 5 MG/5ML PO SOLN: 5.0000 mg | Freq: Once | ORAL | Status: DC | PRN

## 2019-01-13 MED ORDER — CLONIDINE HCL (ANALGESIA) 100 MCG/ML EP SOLN
EPIDURAL | Status: DC | PRN
  Administered 2019-01-13: 100 ug

## 2019-01-13 MED ORDER — ACETAMINOPHEN 325 MG PO TABS
325.0000 mg | ORAL_TABLET | Freq: Four times a day (QID) | ORAL | Status: DC | PRN
  Administered 2019-01-14: 650 mg via ORAL
  Filled 2019-01-13: qty 2

## 2019-01-13 MED ORDER — ONDANSETRON HCL 4 MG PO TABS
4.0000 mg | ORAL_TABLET | Freq: Four times a day (QID) | ORAL | Status: DC | PRN
  Administered 2019-01-15: 4 mg via ORAL
  Filled 2019-01-13: qty 1

## 2019-01-13 MED ORDER — OXYCODONE HCL 5 MG PO TABS: 5.0000 mg | ORAL_TABLET | ORAL | 0 refills | Status: DC | PRN

## 2019-01-13 MED ORDER — LACTATED RINGERS IV SOLN
INTRAVENOUS | Status: DC
  Administered 2019-01-13 (×3): via INTRAVENOUS

## 2019-01-13 MED ORDER — BUPIVACAINE LIPOSOME 1.3 % IJ SUSP
INTRAMUSCULAR | Status: DC | PRN
  Administered 2019-01-13: 20 mL

## 2019-01-13 MED ORDER — MIDAZOLAM HCL 5 MG/5ML IJ SOLN
INTRAMUSCULAR | Status: DC | PRN
  Administered 2019-01-13: 2 mg via INTRAVENOUS

## 2019-01-13 MED ORDER — BISACODYL 5 MG PO TBEC: 5.0000 mg | DELAYED_RELEASE_TABLET | Freq: Every day | ORAL | Status: DC | PRN

## 2019-01-13 MED ORDER — VANCOMYCIN HCL 1000 MG IV SOLR
1000.0000 mg | Freq: Two times a day (BID) | INTRAVENOUS | Status: AC
  Administered 2019-01-13: 1000 mg via INTRAVENOUS
  Filled 2019-01-13 (×2): qty 1000

## 2019-01-13 MED ORDER — ASPIRIN EC 325 MG PO TBEC: 325.0000 mg | DELAYED_RELEASE_TABLET | Freq: Two times a day (BID) | ORAL | 11 refills | Status: AC

## 2019-01-13 MED ORDER — POLYETHYLENE GLYCOL 3350 17 G PO PACK: 17.0000 g | PACK | Freq: Every day | ORAL | Status: DC | PRN

## 2019-01-13 MED ORDER — PROPOFOL 10 MG/ML IV BOLUS
INTRAVENOUS | Status: DC | PRN
  Administered 2019-01-13: 40 mg via INTRAVENOUS

## 2019-01-13 MED ORDER — MENTHOL 3 MG MT LOZG: 1.0000 | LOZENGE | OROMUCOSAL | Status: DC | PRN

## 2019-01-13 MED ORDER — ROPIVACAINE HCL 7.5 MG/ML IJ SOLN
INTRAMUSCULAR | Status: DC | PRN
  Administered 2019-01-13: 30 mL via PERINEURAL

## 2019-01-13 MED ORDER — METOCLOPRAMIDE HCL 5 MG/ML IJ SOLN
5.0000 mg | Freq: Three times a day (TID) | INTRAMUSCULAR | Status: DC | PRN
  Administered 2019-01-14 – 2019-01-15 (×2): 10 mg via INTRAVENOUS
  Filled 2019-01-13 (×2): qty 2

## 2019-01-13 MED ORDER — STERILE WATER FOR IRRIGATION IR SOLN
Status: DC | PRN
  Administered 2019-01-13: 2000 mL

## 2019-01-13 MED ORDER — PHENOL 1.4 % MT LIQD: 1.0000 | OROMUCOSAL | Status: DC | PRN

## 2019-01-13 MED ORDER — ONDANSETRON HCL 4 MG/2ML IJ SOLN
INTRAMUSCULAR | Status: AC
  Filled 2019-01-13: qty 2

## 2019-01-13 MED ORDER — FENTANYL CITRATE (PF) 100 MCG/2ML IJ SOLN
INTRAMUSCULAR | Status: DC | PRN
  Administered 2019-01-13: 100 ug via INTRAVENOUS

## 2019-01-13 MED ORDER — SODIUM CHLORIDE 0.9 % IR SOLN
Status: DC | PRN
  Administered 2019-01-13: 1000 mL

## 2019-01-13 MED ORDER — LACTATED RINGERS IV SOLN: INTRAVENOUS | Status: DC

## 2019-01-13 MED ORDER — CHLORHEXIDINE GLUCONATE 4 % EX LIQD: 60.0000 mL | Freq: Once | CUTANEOUS | Status: DC

## 2019-01-13 MED ORDER — DOCUSATE SODIUM 100 MG PO CAPS
100.0000 mg | ORAL_CAPSULE | Freq: Two times a day (BID) | ORAL | Status: DC
  Administered 2019-01-13 – 2019-01-14 (×3): 100 mg via ORAL
  Filled 2019-01-13 (×4): qty 1

## 2019-01-13 MED ORDER — OXYCODONE HCL 5 MG PO TABS: 5.0000 mg | ORAL_TABLET | Freq: Once | ORAL | Status: DC | PRN

## 2019-01-13 MED ORDER — OXYCODONE HCL 5 MG PO TABS
5.0000 mg | ORAL_TABLET | ORAL | Status: DC | PRN
  Administered 2019-01-13 – 2019-01-14 (×3): 10 mg via ORAL
  Administered 2019-01-14: 5 mg via ORAL
  Filled 2019-01-13: qty 1
  Filled 2019-01-13 (×5): qty 2

## 2019-01-13 MED ORDER — ACETAMINOPHEN 500 MG PO TABS
1000.0000 mg | ORAL_TABLET | Freq: Four times a day (QID) | ORAL | Status: AC
  Administered 2019-01-13 – 2019-01-14 (×3): 1000 mg via ORAL
  Filled 2019-01-13 (×3): qty 2

## 2019-01-13 MED ORDER — VANCOMYCIN HCL IN DEXTROSE 1-5 GM/200ML-% IV SOLN
1000.0000 mg | INTRAVENOUS | Status: AC
  Administered 2019-01-13: 1000 mg via INTRAVENOUS
  Filled 2019-01-13: qty 200

## 2019-01-13 MED ORDER — SODIUM CHLORIDE (PF) 0.9 % IJ SOLN
INTRAMUSCULAR | Status: DC | PRN
  Administered 2019-01-13: 60 mL

## 2019-01-13 MED ORDER — MIDAZOLAM HCL 2 MG/2ML IJ SOLN
INTRAMUSCULAR | Status: AC
  Filled 2019-01-13: qty 2

## 2019-01-13 MED ORDER — OXYCODONE HCL 5 MG PO TABS
10.0000 mg | ORAL_TABLET | ORAL | Status: DC | PRN
  Administered 2019-01-13: 15 mg via ORAL
  Administered 2019-01-13: 10 mg via ORAL
  Filled 2019-01-13: qty 3

## 2019-01-13 MED ORDER — ENOXAPARIN SODIUM 30 MG/0.3ML ~~LOC~~ SOLN
30.0000 mg | Freq: Two times a day (BID) | SUBCUTANEOUS | Status: DC
  Administered 2019-01-14 – 2019-01-15 (×3): 30 mg via SUBCUTANEOUS
  Filled 2019-01-13 (×3): qty 0.3

## 2019-01-13 MED ORDER — ACETAMINOPHEN 160 MG/5ML PO SOLN: 325.0000 mg | ORAL | Status: DC | PRN

## 2019-01-13 MED ORDER — ONDANSETRON HCL 4 MG/2ML IJ SOLN
4.0000 mg | Freq: Four times a day (QID) | INTRAMUSCULAR | Status: DC | PRN
  Administered 2019-01-14 (×2): 4 mg via INTRAVENOUS
  Filled 2019-01-13 (×2): qty 2

## 2019-01-13 MED ORDER — ALUM & MAG HYDROXIDE-SIMETH 200-200-20 MG/5ML PO SUSP: 30.0000 mL | ORAL | Status: DC | PRN

## 2019-01-13 MED ORDER — FERROUS SULFATE 325 (65 FE) MG PO TABS
325.0000 mg | ORAL_TABLET | Freq: Three times a day (TID) | ORAL | Status: DC
  Administered 2019-01-13 – 2019-01-14 (×2): 325 mg via ORAL
  Filled 2019-01-13 (×6): qty 1

## 2019-01-13 MED ORDER — METHOCARBAMOL 500 MG IVPB - SIMPLE MED
500.0000 mg | Freq: Four times a day (QID) | INTRAVENOUS | Status: DC | PRN
  Administered 2019-01-13: 500 mg via INTRAVENOUS
  Filled 2019-01-13: qty 50

## 2019-01-13 MED ORDER — METHOCARBAMOL 500 MG PO TABS
500.0000 mg | ORAL_TABLET | Freq: Four times a day (QID) | ORAL | Status: DC | PRN
  Administered 2019-01-13 – 2019-01-15 (×6): 500 mg via ORAL
  Filled 2019-01-13 (×6): qty 1

## 2019-01-13 MED ORDER — SCOPOLAMINE 1 MG/3DAYS TD PT72
MEDICATED_PATCH | TRANSDERMAL | Status: DC | PRN
  Administered 2019-01-13: 1 via TRANSDERMAL

## 2019-01-13 MED ORDER — SODIUM CHLORIDE (PF) 0.9 % IJ SOLN
INTRAMUSCULAR | Status: AC
  Filled 2019-01-13: qty 50

## 2019-01-13 MED ORDER — METOCLOPRAMIDE HCL 5 MG PO TABS: 5.0000 mg | ORAL_TABLET | Freq: Three times a day (TID) | ORAL | Status: DC | PRN

## 2019-01-13 SURGICAL SUPPLY — 69 items
ADH SKN CLS APL DERMABOND .7 (GAUZE/BANDAGES/DRESSINGS) ×1
APL PRP STRL LF DISP 70% ISPRP (MISCELLANEOUS) ×2
ATTUNE MED DOME PAT 38 KNEE (Knees) ×1 IMPLANT
ATTUNE MED DOME PAT 38MM KNEE (Knees) ×1 IMPLANT
ATTUNE PS FEM RT SZ 4 CEM KNEE (Femur) ×2 IMPLANT
ATTUNE PSRP INSR SZ4 7 KNEE (Insert) ×1 IMPLANT
ATTUNE PSRP INSR SZ4 7MM KNEE (Insert) ×1 IMPLANT
BAG DECANTER FOR FLEXI CONT (MISCELLANEOUS) IMPLANT
BAG SPEC THK2 15X12 ZIP CLS (MISCELLANEOUS) ×2
BAG ZIPLOCK 12X15 (MISCELLANEOUS) ×6 IMPLANT
BANDAGE ACE 4X5 VEL STRL LF (GAUZE/BANDAGES/DRESSINGS) ×3 IMPLANT
BANDAGE ACE 6X5 VEL STRL LF (GAUZE/BANDAGES/DRESSINGS) ×3 IMPLANT
BANDAGE ELASTIC 4 VELCRO ST LF (GAUZE/BANDAGES/DRESSINGS) ×2 IMPLANT
BANDAGE ELASTIC 6 VELCRO ST LF (GAUZE/BANDAGES/DRESSINGS) ×2 IMPLANT
BASE TIBIAL ROT PLAT SZ 5 KNEE (Knees) IMPLANT
BLADE SAG 18X100X1.27 (BLADE) ×3 IMPLANT
BLADE SAW SGTL 11.0X1.19X90.0M (BLADE) ×3 IMPLANT
BOWL SMART MIX CTS (DISPOSABLE) ×3 IMPLANT
BSPLAT TIB 5 CMNT ROT PLAT STR (Knees) ×1 IMPLANT
CEMENT HV SMART SET (Cement) ×4 IMPLANT
CHLORAPREP W/TINT 26 (MISCELLANEOUS) ×6 IMPLANT
COVER SURGICAL LIGHT HANDLE (MISCELLANEOUS) ×3 IMPLANT
COVER WAND RF STERILE (DRAPES) IMPLANT
CUFF TOURN SGL QUICK 34 (TOURNIQUET CUFF) ×3
CUFF TRNQT CYL 34X4.125X (TOURNIQUET CUFF) ×1 IMPLANT
DECANTER SPIKE VIAL GLASS SM (MISCELLANEOUS) ×3 IMPLANT
DERMABOND ADVANCED (GAUZE/BANDAGES/DRESSINGS) ×2
DERMABOND ADVANCED .7 DNX12 (GAUZE/BANDAGES/DRESSINGS) ×1 IMPLANT
DRAPE U-SHAPE 47X51 STRL (DRAPES) ×3 IMPLANT
DRSG AQUACEL AG ADV 3.5X10 (GAUZE/BANDAGES/DRESSINGS) ×3 IMPLANT
DRSG TEGADERM 4X4.75 (GAUZE/BANDAGES/DRESSINGS) ×3 IMPLANT
ELECT REM PT RETURN 15FT ADLT (MISCELLANEOUS) ×3 IMPLANT
EVACUATOR 1/8 PVC DRAIN (DRAIN) ×3 IMPLANT
GAUZE SPONGE 2X2 8PLY STRL LF (GAUZE/BANDAGES/DRESSINGS) ×1 IMPLANT
GLOVE BIO SURGEON STRL SZ7.5 (GLOVE) ×6 IMPLANT
GLOVE BIOGEL PI IND STRL 8 (GLOVE) ×2 IMPLANT
GLOVE BIOGEL PI INDICATOR 8 (GLOVE) ×4
GLOVE ECLIPSE 8.0 STRL XLNG CF (GLOVE) ×6 IMPLANT
GLOVE SURG ORTHO 9.0 STRL STRW (GLOVE) ×3 IMPLANT
GOWN STRL REUS W/TWL XL LVL3 (GOWN DISPOSABLE) ×6 IMPLANT
HANDPIECE INTERPULSE COAX TIP (DISPOSABLE) ×3
HOLDER FOLEY CATH W/STRAP (MISCELLANEOUS) IMPLANT
KIT TURNOVER KIT A (KITS) IMPLANT
NS IRRIG 1000ML POUR BTL (IV SOLUTION) ×3 IMPLANT
PACK TOTAL KNEE CUSTOM (KITS) ×3 IMPLANT
PIN STEINMAN FIXATION KNEE (PIN) ×2 IMPLANT
PIN THREADED HEADED SIGMA (PIN) ×2 IMPLANT
PROTECTOR NERVE ULNAR (MISCELLANEOUS) ×3 IMPLANT
SET HNDPC FAN SPRY TIP SCT (DISPOSABLE) ×1 IMPLANT
SET PAD KNEE POSITIONER (MISCELLANEOUS) ×3 IMPLANT
SPONGE GAUZE 2X2 STER 10/PKG (GAUZE/BANDAGES/DRESSINGS) ×2
SPONGE LAP 18X18 RF (DISPOSABLE) IMPLANT
SPONGE SURGIFOAM ABS GEL 100 (HEMOSTASIS) ×3 IMPLANT
STOCKINETTE 6  STRL (DRAPES) ×2
STOCKINETTE 6 STRL (DRAPES) ×1 IMPLANT
SUT BONE WAX W31G (SUTURE) IMPLANT
SUT MNCRL AB 3-0 PS2 18 (SUTURE) ×3 IMPLANT
SUT VIC AB 1 CT1 27 (SUTURE) ×9
SUT VIC AB 1 CT1 27XBRD ANTBC (SUTURE) ×3 IMPLANT
SUT VIC AB 2-0 CT1 27 (SUTURE) ×9
SUT VIC AB 2-0 CT1 TAPERPNT 27 (SUTURE) ×2 IMPLANT
SUT VLOC 180 0 24IN GS25 (SUTURE) ×3 IMPLANT
SYR 50ML LL SCALE MARK (SYRINGE) IMPLANT
TAPE STRIPS DRAPE STRL (GAUZE/BANDAGES/DRESSINGS) ×3 IMPLANT
TIBIAL BASE ROT PLAT SZ 5 KNEE (Knees) ×3 IMPLANT
TRAY FOLEY MTR SLVR 16FR STAT (SET/KITS/TRAYS/PACK) ×3 IMPLANT
WATER STERILE IRR 1000ML POUR (IV SOLUTION) ×6 IMPLANT
WRAP KNEE MAXI GEL POST OP (GAUZE/BANDAGES/DRESSINGS) ×3 IMPLANT
YANKAUER SUCT BULB TIP 10FT TU (MISCELLANEOUS) ×3 IMPLANT

## 2019-01-13 NOTE — Anesthesia Procedure Notes (Signed)
Anesthesia Regional Block: Adductor canal block   Pre-Anesthetic Checklist: ,, timeout performed, Correct Patient, Correct Site, Correct Laterality, Correct Procedure, Correct Position, site marked, Risks and benefits discussed,  Surgical consent,  Pre-op evaluation,  At surgeon's request and post-op pain management  Laterality: Right  Prep: chloraprep       Needles:  Injection technique: Single-shot  Needle Type: Echogenic Stimulator Needle     Needle Length: 5cm  Needle Gauge: 22     Additional Needles:   Procedures:, nerve stimulator,,, ultrasound used (permanent image in chart),,,,  Narrative:  Start time: 01/13/2019 7:00 AM End time: 01/13/2019 7:10 AM Injection made incrementally with aspirations every 5 mL.  Performed by: Personally  Anesthesiologist: Janeece Riggers, MD  Additional Notes: Functioning IV was confirmed and monitors were applied.  A 35mm 22ga Arrow echogenic stimulator needle was used. Sterile prep and drape,hand hygiene and sterile gloves were used. Ultrasound guidance: relevant anatomy identified, needle position confirmed, local anesthetic spread visualized around nerve(s)., vascular puncture avoided.  Image printed for medical record. Negative aspiration and negative test dose prior to incremental administration of local anesthetic. The patient tolerated the procedure well.

## 2019-01-13 NOTE — Transfer of Care (Signed)
Immediate Anesthesia Transfer of Care Note  Patient: Stacie Webster  Procedure(s) Performed: TOTAL KNEE ARTHROPLASTY (Right )  Patient Location: PACU  Anesthesia Type:Spinal  Level of Consciousness: awake, alert  and oriented  Airway & Oxygen Therapy: Patient Spontanous Breathing and Patient connected to face mask oxygen  Post-op Assessment: Report given to RN and Post -op Vital signs reviewed and stable  Post vital signs: Reviewed and stable  Last Vitals:  Vitals Value Taken Time  BP 120/75 01/13/2019 10:10 AM  Temp    Pulse 85 01/13/2019 10:12 AM  Resp 21 01/13/2019 10:12 AM  SpO2 99 % 01/13/2019 10:12 AM  Vitals shown include unvalidated device data.  Last Pain:  Vitals:   01/13/19 0617  TempSrc:   PainSc: 4       Patients Stated Pain Goal: 4 (79/39/68 [id number])  Complications: none

## 2019-01-13 NOTE — H&P (View-Only) (Signed)
Lab unable to run BMP, asking for redraw. Per Dr. Ambrose Pancoast, no need to redraw BMP today. Order cancelled.

## 2019-01-13 NOTE — Anesthesia Procedure Notes (Signed)
Spinal  Patient location during procedure: OR Start time: 01/13/2019 7:45 AM End time: 01/13/2019 7:50 AM Staffing Anesthesiologist: Janeece Riggers, MD Preanesthetic Checklist Completed: patient identified, site marked, surgical consent, pre-op evaluation, timeout performed, IV checked, risks and benefits discussed and monitors and equipment checked Spinal Block Patient position: sitting Prep: DuraPrep Patient monitoring: heart rate, cardiac monitor, continuous pulse ox and blood pressure Approach: midline Location: L4-5 Injection technique: single-shot Needle Needle type: Sprotte  Needle gauge: 24 G Needle length: 9 cm Assessment Sensory level: T4

## 2019-01-13 NOTE — Op Note (Signed)
DATE OF SURGERY:  01/13/2019  TIME: 9:51 AM  PATIENT NAME:  Stacie Webster    AGE: 48 y.o.   PRE-OPERATIVE DIAGNOSIS:  right knee osteoarthritis  POST-OPERATIVE DIAGNOSIS:  right knee osteoarthritis  PROCEDURE:  Procedure(s): TOTAL KNEE ARTHROPLASTY  SURGEON:  Tian Mcmurtrey ANDREW  ASSISTANT:  Bryson Stilwell, PA-C, present and scrubbed throughout the case, critical for assistance with exposure, retraction, instrumentation, and closure.  OPERATIVE IMPLANTS: Depuy PFC Atune Rotating Platform.  Femur size 4, Tibia size 5, Patella size 38 3-peg oval button, with a 7 mm polyethylene insert.   PREOPERATIVE INDICATIONS:   Stacie Webster is a 48 y.o. year old female with end stage bone on bone arthritis of the knee who failed conservative treatment and elected for Total Knee Arthroplasty.   The risks, benefits, and alternatives were discussed at length including but not limited to the risks of infection, bleeding, nerve injury, stiffness, blood clots, the need for revision surgery, cardiopulmonary complications, among others, and they were willing to proceed.  OPERATIVE DESCRIPTION:  The patient was brought to the operative room and placed in a supine position.  Spinal anesthesia was administered.  IV antibiotics were given.  The lower extremity was prepped and draped in the usual sterile fashion.  Time out was performed.  The leg was elevated and exsanguinated and the tourniquet was inflated.  Anterior quadriceps tendon splitting approach was performed.  The patella was retracted and osteophytes were removed.  The anterior horn of the medial and lateral meniscus was removed and cruciate ligaments resected.   The distal femur was opened with the drill and the intramedullary distal femoral cutting jig was utilized, set at 5 degrees resecting 10 mm off the distal femur.  Care was taken to protect the collateral ligaments.  The distal femoral sizing jig was applied, taking care to  avoid notching.  Then the 4-in-1 cutting jig was applied and the anterior and posterior femur was cut, along with the chamfer cuts.    Then the extramedullary tibial cutting jig was utilized making the appropriate cut using the anterior tibial crest as a reference building in appropriate posterior slope.  Care was taken during the cut to protect the medial and collateral ligaments.  The proximal tibia was removed along with the posterior horns of the menisci.   The posterior medial femoral osteophytes and posterior lateral femoral osteophytes were removed.    The flexion gap was then measured and was symmetric with the extension gap, measured at 7.  I completed the distal femoral preparation using the appropriate jig to prepare the box.  The patella was then measured, and cut with the saw.    The proximal tibia sized and prepared accordingly with the reamer and the punch, and then all components were trialed with the trial insert.  The knee was found to have excellent balance and full motion.    The above named components were then cemented into place and all excess cement was removed.  The trial polyethylene component was in place during cementation, and then was exchanged for the real polyethylene component.    The knee was easily taken through a range of motion and the patella tracked well and the knee irrigated copiously and the parapatellar and subcutaneous tissue closed with vicryl, and monocryl with steri strips for the skin.  The arthrotomy was closed at 90 of flexion. The wounds were dressed with sterile gauze and the tourniquet released and the patient was awakened and returned to the PACU in  stable and satisfactory condition.  There were no complications.  Total tourniquet time was 90 minutes.

## 2019-01-13 NOTE — Evaluation (Signed)
Physical Therapy Evaluation Patient Details Name: Stacie Webster MRN: [id number] DOB: [birth year] Today's Date: 01/13/2019   History of Present Illness  Pt s/p R TKR  Clinical Impression  Pt s/p R TKR and presents with decreased R LE strength/ROM and post op pain limiting functional mobility.  Pt should progress to dc home with family assist.    Follow Up Recommendations Follow surgeon's recommendation for DC plan and follow-up therapies    Equipment Recommendations  None recommended by PT    Recommendations for Other Services       Precautions / Restrictions Precautions Precautions: Fall;Knee Restrictions Weight Bearing Restrictions: No Other Position/Activity Restrictions: WBAT      Mobility  Bed Mobility Overal bed mobility: Needs Assistance Bed Mobility: Supine to Sit     Supine to sit: Min guard     General bed mobility comments: min cues for sequence; min guard to manage R LE  Transfers Overall transfer level: Needs assistance   Transfers: Sit to/from Stand Sit to Stand: Min assist         General transfer comment: cues for LE management and use of UEs to self assist`  Ambulation/Gait Ambulation/Gait assistance: Min assist Gait Distance (Feet): 38 Feet Assistive device: Rolling walker (2 wheeled) Gait Pattern/deviations: Step-to pattern;Decreased step length - right;Decreased step length - left;Shuffle;Decreased stance time - right;Antalgic;Trunk flexed Gait velocity: decr   General Gait Details: cues for posture, position from RW and sequence; initial min WB tolerated R LE but with progressive improvement   Stairs            Wheelchair Mobility    Modified Rankin (Stroke Patients Only)       Balance Overall balance assessment: Needs assistance Sitting-balance support: No upper extremity supported;Feet supported Sitting balance-Leahy Scale: Good     Standing balance support: Bilateral upper extremity supported Standing  balance-Leahy Scale: Fair                               Pertinent Vitals/Pain Pain Assessment: 0-10 Pain Score: 5  Pain Location: R knee and groin pain Pain Descriptors / Indicators: Aching;Sore Pain Intervention(s): Limited activity within patient's tolerance;Monitored during session;Premedicated before session;Ice applied    Home Living Family/patient expects to be discharged to:: Private residence Living Arrangements: Spouse/significant other Available Help at Discharge: Family Type of Home: House Home Access: Stairs to enter   Technical brewer of Steps: 1 Home Layout: Two level Moore: Environmental consultant - 2 wheels      Prior Function Level of Independence: Independent         Comments: Works as OP PT     Journalist, newspaper        Extremity/Trunk Assessment   Upper Extremity Assessment Upper Extremity Assessment: Overall WFL for tasks assessed    Lower Extremity Assessment Lower Extremity Assessment: RLE deficits/detail RLE Deficits / Details: 3/5 quads with IND SLR; AAROM at knee -8 - 60    Cervical / Trunk Assessment Cervical / Trunk Assessment: Normal  Communication   Communication: No difficulties  Cognition Arousal/Alertness: Awake/alert Behavior During Therapy: WFL for tasks assessed/performed Overall Cognitive Status: Within Functional Limits for tasks assessed                                        General Comments      Exercises Total Joint Exercises Ankle  Circles/Pumps: AROM;Both;15 reps;Supine Heel Slides: AAROM;Right;15 reps;Supine   Assessment/Plan    PT Assessment Patient needs continued PT services  PT Problem List Decreased strength;Decreased range of motion;Decreased activity tolerance;Decreased mobility;Decreased knowledge of use of DME;Pain       PT Treatment Interventions DME instruction;Gait training;Stair training;Functional mobility training;Therapeutic activities;Therapeutic  exercise;Patient/family education    PT Goals (Current goals can be found in the Care Plan section)  Acute Rehab PT Goals Patient Stated Goal: Regain IND PT Goal Formulation: With patient Time For Goal Achievement: 01/20/19 Potential to Achieve Goals: Good    Frequency 7X/week   Barriers to discharge        Co-evaluation               AM-PAC PT "6 Clicks" Mobility  Outcome Measure Help needed turning from your back to your side while in a flat bed without using bedrails?: A Little Help needed moving from lying on your back to sitting on the side of a flat bed without using bedrails?: A Little Help needed moving to and from a bed to a chair (including a wheelchair)?: A Little Help needed standing up from a chair using your arms (e.g., wheelchair or bedside chair)?: A Little Help needed to walk in hospital room?: A Little Help needed climbing 3-5 steps with a railing? : A Lot 6 Click Score: 17    End of Session Equipment Utilized During Treatment: Gait belt Activity Tolerance: Patient tolerated treatment well Patient left: in chair;with call bell/phone within reach;with chair alarm set Nurse Communication: Mobility status PT Visit Diagnosis: Difficulty in walking, not elsewhere classified (R26.2)    Time: 8250-0370 PT Time Calculation (min) (ACUTE ONLY): 28 min   Charges:   PT Evaluation $PT Eval Low Complexity: 1 Low PT Treatments $Gait Training: 8-22 mins        [place] Pager [phone] Office [phone]   Marcellino Fidalgo 01/13/2019, 3:04 PM

## 2019-01-13 NOTE — Anesthesia Postprocedure Evaluation (Signed)
Anesthesia Post Note  Patient: Stacie Webster  Procedure(s) Performed: TOTAL KNEE ARTHROPLASTY (Right )     Patient location during evaluation: PACU Anesthesia Type: Spinal Level of consciousness: oriented and awake and alert Pain management: pain level controlled Vital Signs Assessment: post-procedure vital signs reviewed and stable Respiratory status: spontaneous breathing, respiratory function stable and patient connected to nasal cannula oxygen Cardiovascular status: blood pressure returned to baseline and stable Postop Assessment: no headache, no backache and no apparent nausea or vomiting Anesthetic complications: no    Last Vitals:  Vitals:   01/13/19 1100 01/13/19 1125  BP: 129/86 120/61  Pulse: 66 63  Resp: 13 12  Temp: 36.5 C 36.7 C  SpO2: 100% 100%    Last Pain:  Vitals:   01/13/19 1212  TempSrc:   PainSc: 10-Worst pain ever                 Khaylee Mcevoy

## 2019-01-13 NOTE — Interval H&P Note (Signed)
History and Physical Interval Note:  01/13/2019 7:35 AM  Stacie Webster  has presented today for surgery, with the diagnosis of right knee osteoarthritis.  The various methods of treatment have been discussed with the patient and family. After consideration of risks, benefits and other options for treatment, the patient has consented to  Procedure(s) with comments: TOTAL KNEE ARTHROPLASTY (Right) - 120 minutes as a surgical intervention.  The patient's history has been reviewed, patient examined, no change in status, stable for surgery.  I have reviewed the patient's chart and labs.  Questions were answered to the patient's satisfaction.     Andrika Peraza ANDREW

## 2019-01-13 NOTE — Interval H&P Note (Signed)
History and Physical Interval Note:  01/13/2019 7:35 AM  Stacie Webster  has presented today for surgery, with the diagnosis of right knee osteoarthritis.  The various methods of treatment have been discussed with the patient and family. After consideration of risks, benefits and other options for treatment, the patient has consented to  Procedure(s) with comments: TOTAL KNEE ARTHROPLASTY (Right) - 120 minutes as a surgical intervention.  The patient's history has been reviewed, patient examined, no change in status, stable for surgery.  I have reviewed the patient's chart and labs.  Questions were answered to the patient's satisfaction.     Mala Gibbard ANDREW

## 2019-01-13 NOTE — Progress Notes (Signed)
Lab unable to run BMP, asking for redraw. Per Dr. Ambrose Pancoast, no need to redraw BMP today. Order cancelled.

## 2019-01-14 DIAGNOSIS — R112 Nausea with vomiting, unspecified: Secondary | ICD-10-CM | POA: Diagnosis present

## 2019-01-14 DIAGNOSIS — M1711 Unilateral primary osteoarthritis, right knee: Secondary | ICD-10-CM | POA: Diagnosis not present

## 2019-01-14 LAB — BASIC METABOLIC PANEL
Anion gap: 9 (ref 5–15)
BUN: 13 mg/dL (ref 6–20)
CHLORIDE: 107 mmol/L (ref 98–111)
CO2: 23 mmol/L (ref 22–32)
Calcium: 8.4 mg/dL — ABNORMAL LOW (ref 8.9–10.3)
Creatinine, Ser: 0.62 mg/dL (ref 0.44–1.00)
GFR calc non Af Amer: >60 mL/min (ref >60)
Glucose, Bld: 133 mg/dL — ABNORMAL HIGH (ref 70–99)
Potassium: 3.4 mmol/L — ABNORMAL LOW (ref 3.5–5.1)
Sodium: 139 mmol/L (ref 135–145)

## 2019-01-14 LAB — CBC
HCT: 32.2 % — ABNORMAL LOW (ref 36.0–46.0)
HEMOGLOBIN: 10.4 g/dL — AB (ref 12.0–15.0)
MCH: 30.8 pg (ref 26.0–34.0)
MCHC: 32.3 g/dL (ref 30.0–36.0)
MCV: 95.3 fL (ref 80.0–100.0)
Platelets: 288 10*3/uL (ref 150–400)
RBC: 3.38 MIL/uL — ABNORMAL LOW (ref 3.87–5.11)
RDW: 12.4 % (ref 11.5–15.5)
WBC: 12.2 10*3/uL — ABNORMAL HIGH (ref 4.0–10.5)
nRBC: 0 % (ref 0.0–0.2)

## 2019-01-14 MED ORDER — PROMETHAZINE HCL 25 MG/ML IJ SOLN: 12.5000 mg | Freq: Four times a day (QID) | INTRAMUSCULAR | Status: DC | PRN

## 2019-01-14 MED ORDER — HYDROCODONE-ACETAMINOPHEN 5-325 MG PO TABS
1.0000 | ORAL_TABLET | ORAL | Status: DC | PRN
  Administered 2019-01-14 – 2019-01-15 (×4): 2 via ORAL
  Filled 2019-01-14 (×4): qty 2

## 2019-01-14 NOTE — Progress Notes (Addendum)
At [place], Jenetta Loges, PA was paged regarding the pt's continuous nausea possibly r/t the pt's pain meds. The pt requested for her pain meds to be changed d/t the nausea.   Jenetta Loges, PA returned my page and gave verbal orders for Phenergan 12.5 mg IV PRN Q-6 for nausea, NORCO 5-325 1-2 tablets PRN Q-4 for moderate pain. Verbal orders were also given to d/c the oxycodone.

## 2019-01-14 NOTE — TOC Initial Note (Signed)
Transition of Care (TOC) - Initial/Assessment Note    Patient Details  Name: Stacie Webster MRN: [id number] Date of Birth: 04/25/1971  Transition of Care (TOC) CM/SW Contact:    Oliveras-Aizpurua, Jeannette, RN Phone Number: 01/14/2019, 5:35 PM  Clinical Narrative: 48 yo F s/p R TKR. Received referral to assist with HH and DME. Met with pt and husband. She plans to return home with the support of her husband. She has a walker. She starts outpt rehab on Monday.               Expected Discharge Plan: OP Rehab Barriers to Discharge: No Barriers Identified   Patient Goals and CMS Choice Patient states their goals for this hospitalization and ongoing recovery are:: to get better      Expected Discharge Plan and Services Expected Discharge Plan: OP Rehab Discharge Planning Services: CM Consult     Expected Discharge Date: 01/13/19                        Prior Living Arrangements/Services   Lives with:: Spouse Patient language and need for interpreter reviewed:: No Do you feel safe going back to the place where you live?: Yes      Need for Family Participation in Patient Care: Yes (Comment) Care giver support system in place?: Yes (comment)      Activities of Daily Living Home Assistive Devices/Equipment: Walker (specify type), Blood pressure cuff, Eyeglasses(Front wheel walker, Readers) ADL Screening (condition at time of admission) Patient's cognitive ability adequate to safely complete daily activities?: Yes Is the patient deaf or have difficulty hearing?: No Does the patient have difficulty seeing, even when wearing glasses/contacts?: No Does the patient have difficulty concentrating, remembering, or making decisions?: No Patient able to express need for assistance with ADLs?: Yes Does the patient have difficulty dressing or bathing?: No Independently performs ADLs?: Yes (appropriate for developmental age) Does the patient have difficulty walking or climbing  stairs?: Yes Weakness of Legs: Right Weakness of Arms/Hands: None  Permission Sought/Granted Permission sought to share information with : Case Manager Permission granted to share information with : Yes, Verbal Permission Granted              Emotional Assessment Appearance:: Well-Groomed Attitude/Demeanor/Rapport: Engaged Affect (typically observed): Calm, Appropriate Orientation: : Oriented to Self, Oriented to Place, Oriented to  Time, Oriented to Situation      Admission diagnosis:  right knee osteoarthritis Patient Active Problem List   Diagnosis Date Noted  . S/P knee replacement 01/13/2019  . Physical exam 11/30/2018  . Obesity (BMI 35.0-39.9 without comorbidity) 11/07/2018  . Family history of colon cancer paternal grandmother and great grandmother and father w/ polyps 03/09/2018  . Allergic rhinitis 12/19/2012  . ANISOCORIA 03/26/2009  . NYSTAGMUS 03/26/2009  . BACK PAIN 01/23/2009   PCP:  Tabori, Katherine E, MD Pharmacy:   [facility] [place] [facility] - [place] - [address] [place] [address] Phone: [phone] Fax: [phone]     Social Determinants of Health (SDOH) Interventions    Readmission Risk Interventions 30 Day Unplanned Readmission Risk Score     Admission (Current) from 01/13/2019 in [facility]  30 Day Unplanned Readmission Risk Score (%)  4 Filed at 01/13/2019 0801     This score is the patient's risk of an unplanned readmission within 30 days of being discharged (0 -100%). The score is based on dignosis,   age, lab data, medications, orders, and past utilization.   Low:  0-14.9   Medium: 15-21.9   High: 22-29.9   Extreme: 30 and above       No flowsheet data found.  

## 2019-01-14 NOTE — Progress Notes (Signed)
Physical Therapy Treatment Patient Details Name: ARIABELLA BRIEN MRN: [id number] DOB: 05-19-1971 Today's Date: 01/14/2019    History of Present Illness Pt s/p R TKR    PT Comments    Pt continues very motivated but ltd by R knee/thigh/hip pain and nausea.   Follow Up Recommendations  Follow surgeon's recommendation for DC plan and follow-up therapies     Equipment Recommendations  None recommended by PT    Recommendations for Other Services       Precautions / Restrictions Precautions Precautions: Fall;Knee Restrictions Weight Bearing Restrictions: No RLE Weight Bearing: Weight bearing as tolerated    Mobility  Bed Mobility Overal bed mobility: Needs Assistance Bed Mobility: Supine to Sit     Supine to sit: Min guard     General bed mobility comments: min cues for sequence; min guard to manage R LE  Transfers Overall transfer level: Needs assistance Equipment used: Rolling walker (2 wheeled) Transfers: Sit to/from Stand Sit to Stand: Min guard         General transfer comment: cues for LE management and use of UEs to self assist`  Ambulation/Gait Ambulation/Gait assistance: Min guard Gait Distance (Feet): 38 Feet Assistive device: Rolling walker (2 wheeled) Gait Pattern/deviations: Step-to pattern;Decreased step length - right;Decreased step length - left;Shuffle;Decreased stance time - right;Antalgic;Trunk flexed Gait velocity: decr   General Gait Details: cues for posture, position from RW and sequence; initial min WB tolerated R LE but with progressive improvement    Stairs             Wheelchair Mobility    Modified Rankin (Stroke Patients Only)       Balance Overall balance assessment: Needs assistance Sitting-balance support: No upper extremity supported;Feet supported Sitting balance-Leahy Scale: Good     Standing balance support: Bilateral upper extremity supported Standing balance-Leahy Scale: Fair                               Cognition Arousal/Alertness: Awake/alert Behavior During Therapy: WFL for tasks assessed/performed Overall Cognitive Status: Within Functional Limits for tasks assessed                                        Exercises Total Joint Exercises Ankle Circles/Pumps: AROM;Both;15 reps;Supine Quad Sets: AROM;Both;10 reps;Supine Heel Slides: AAROM;Right;15 reps;Supine Straight Leg Raises: AAROM;AROM;Right;10 reps;Supine Goniometric ROM: AAROM R knee -10 - 60 - pain limited    General Comments        Pertinent Vitals/Pain Pain Assessment: 0-10 Pain Score: 7  Pain Location: R knee and groin pain Pain Descriptors / Indicators: Aching;Sore Pain Intervention(s): Limited activity within patient's tolerance;Monitored during session;Premedicated before session;Ice applied    Home Living                      Prior Function            PT Goals (current goals can now be found in the care plan section) Acute Rehab PT Goals Patient Stated Goal: Regain IND PT Goal Formulation: With patient Time For Goal Achievement: 01/20/19 Potential to Achieve Goals: Good Progress towards PT goals: Progressing toward goals    Frequency    7X/week      PT Plan Current plan remains appropriate    Co-evaluation  AM-PAC PT "6 Clicks" Mobility   Outcome Measure  Help needed turning from your back to your side while in a flat bed without using bedrails?: A Little Help needed moving from lying on your back to sitting on the side of a flat bed without using bedrails?: A Little Help needed moving to and from a bed to a chair (including a wheelchair)?: A Little Help needed standing up from a chair using your arms (e.g., wheelchair or bedside chair)?: A Little Help needed to walk in hospital room?: A Little Help needed climbing 3-5 steps with a railing? : A Lot 6 Click Score: 17    End of Session Equipment Utilized During Treatment: Gait  belt Activity Tolerance: Patient tolerated treatment well Patient left: in chair;with call bell/phone within reach;with chair alarm set Nurse Communication: Mobility status PT Visit Diagnosis: Difficulty in walking, not elsewhere classified (R26.2)     Time: 0962-8366 PT Time Calculation (min) (ACUTE ONLY): 28 min  Charges:  $Gait Training: 8-22 mins $Therapeutic Exercise: 8-22 mins                     [place] Pager [phone] Office [phone]     Belisa Eichholz 01/14/2019, 12:57 PM

## 2019-01-14 NOTE — Progress Notes (Signed)
Stacie Webster  MRN: [id number] DOB/Age: [birth year] 48 y.o. [facility] Procedure: Procedure(s) (LRB): TOTAL KNEE ARTHROPLASTY (Right)     Subjective: Multiple complaints this am. Getting a little nauseated. Also c/o right hip and groin pain. Had this preop from walking abnormal but has worsened making it tough to move  Vital Signs Temp:  [97.8 F (36.6 C)-98.6 F (37 C)] 98.6 F (37 C) (03/14 1046) Pulse Rate:  [63-77] 75 (03/14 1046) Resp:  [12-16] 16 (03/14 1046) BP: (106-139)/(55-71) 131/71 (03/14 1046) SpO2:  [99 %-100 %] 100 % (03/14 1046)  Lab Results Recent Labs    01/14/19 0448  WBC 12.2*  HGB 10.4*  HCT 32.2*  PLT 288   BMET Recent Labs    01/14/19 0448  NA 139  K 3.4*  CL 107  CO2 23  GLUCOSE 133*  BUN 13  CREATININE 0.62  CALCIUM 8.4*   INR  Date Value Ref Range Status  01/09/2019 1.0 0.8 - 1.2 Final    Comment:    (NOTE) INR goal varies based on device and disease states. Performed at [facility], [place] [address] [place], [place] [address]      Exam Hemovac no longer charged. Removed without difficulty NVI  Right thigh and calf soft        Plan Continue PT/ OT and prn analgesics We discussed work up of her hip pain post op If feeling better possibly home tomorrow  Jenetta Loges PA-C  01/14/2019, 11:01 AM Contact # [phone]

## 2019-01-14 NOTE — Progress Notes (Signed)
Physical Therapy Treatment Patient Details Name: Stacie Webster MRN: [id number] DOB: 1971-08-10 Today's Date: 01/14/2019    History of Present Illness Pt s/p R TKR    PT Comments    Pt with improved activity tolerance this pm and with improved pain control and no c/o nausea.   Follow Up Recommendations  Follow surgeon's recommendation for DC plan and follow-up therapies     Equipment Recommendations  None recommended by PT    Recommendations for Other Services       Precautions / Restrictions Precautions Precautions: Fall;Knee Restrictions Weight Bearing Restrictions: No RLE Weight Bearing: Weight bearing as tolerated Other Position/Activity Restrictions: WBAT    Mobility  Bed Mobility Overal bed mobility: Needs Assistance Bed Mobility: Supine to Sit;Sit to Supine     Supine to sit: Min guard Sit to supine: Min guard   General bed mobility comments: min cues for sequence; min guard to manage R LE  Transfers Overall transfer level: Needs assistance Equipment used: Rolling walker (2 wheeled) Transfers: Sit to/from Stand Sit to Stand: Min guard;Supervision         General transfer comment: min cues for LE management and use of UEs to self assist`  Ambulation/Gait Ambulation/Gait assistance: Min guard;Supervision Gait Distance (Feet): 90 Feet(and 15' into bathroom) Assistive device: Rolling walker (2 wheeled) Gait Pattern/deviations: Step-to pattern;Decreased step length - right;Decreased step length - left;Shuffle;Decreased stance time - right;Antalgic;Trunk flexed Gait velocity: decr   General Gait Details: min cues for posture, position from RW and initial sequence   Stairs             Wheelchair Mobility    Modified Rankin (Stroke Patients Only)       Balance Overall balance assessment: Needs assistance Sitting-balance support: No upper extremity supported;Feet supported Sitting balance-Leahy Scale: Good     Standing balance  support: Bilateral upper extremity supported Standing balance-Leahy Scale: Fair                              Cognition Arousal/Alertness: Awake/alert Behavior During Therapy: WFL for tasks assessed/performed Overall Cognitive Status: Within Functional Limits for tasks assessed                                        Exercises Total Joint Exercises Ankle Circles/Pumps: AROM;Both;15 reps;Supine Quad Sets: AROM;Both;10 reps;Supine Heel Slides: AAROM;Right;15 reps;Supine Straight Leg Raises: AAROM;AROM;Right;10 reps;Supine Goniometric ROM: AAROM R knee -10 - 60 - pain limited    General Comments        Pertinent Vitals/Pain Pain Assessment: 0-10 Pain Score: 6  Pain Location: R knee and groin pain Pain Descriptors / Indicators: Aching;Sore Pain Intervention(s): Limited activity within patient's tolerance;Monitored during session;Premedicated before session    Home Living                      Prior Function            PT Goals (current goals can now be found in the care plan section) Acute Rehab PT Goals Patient Stated Goal: Regain IND PT Goal Formulation: With patient Time For Goal Achievement: 01/20/19 Potential to Achieve Goals: Good Progress towards PT goals: Progressing toward goals    Frequency    7X/week      PT Plan Current plan remains appropriate    Co-evaluation  AM-PAC PT "6 Clicks" Mobility   Outcome Measure  Help needed turning from your back to your side while in a flat bed without using bedrails?: A Little Help needed moving from lying on your back to sitting on the side of a flat bed without using bedrails?: A Little Help needed moving to and from a bed to a chair (including a wheelchair)?: A Little Help needed standing up from a chair using your arms (e.g., wheelchair or bedside chair)?: A Little Help needed to walk in hospital room?: A Little Help needed climbing 3-5 steps with a  railing? : A Lot 6 Click Score: 17    End of Session Equipment Utilized During Treatment: Gait belt Activity Tolerance: Patient tolerated treatment well Patient left: in bed;with call bell/phone within reach Nurse Communication: Mobility status PT Visit Diagnosis: Difficulty in walking, not elsewhere classified (R26.2)     Time: 3790-2409 PT Time Calculation (min) (ACUTE ONLY): 20 min  Charges:  $Gait Training: 8-22 mins $Therapeutic Exercise: 8-22 mins                     [place] Pager [phone] Office [phone]    Kiara Mcdowell 01/14/2019, 3:40 PM

## 2019-01-15 DIAGNOSIS — M1711 Unilateral primary osteoarthritis, right knee: Secondary | ICD-10-CM | POA: Diagnosis not present

## 2019-01-15 LAB — CBC
HCT: 31.2 % — ABNORMAL LOW (ref 36.0–46.0)
Hemoglobin: 9.9 g/dL — ABNORMAL LOW (ref 12.0–15.0)
MCH: 30.2 pg (ref 26.0–34.0)
MCHC: 31.7 g/dL (ref 30.0–36.0)
MCV: 95.1 fL (ref 80.0–100.0)
PLATELETS: 295 10*3/uL (ref 150–400)
RBC: 3.28 MIL/uL — ABNORMAL LOW (ref 3.87–5.11)
RDW: 12.5 % (ref 11.5–15.5)
WBC: 11.7 10*3/uL — ABNORMAL HIGH (ref 4.0–10.5)
nRBC: 0 % (ref 0.0–0.2)

## 2019-01-15 MED ORDER — HYDROCODONE-ACETAMINOPHEN 5-325 MG PO TABS: 1.0000 | ORAL_TABLET | Freq: Four times a day (QID) | ORAL | 0 refills | Status: DC | PRN

## 2019-01-15 MED ORDER — ONDANSETRON HCL 4 MG PO TABS: 4.0000 mg | ORAL_TABLET | Freq: Four times a day (QID) | ORAL | 0 refills | Status: DC | PRN

## 2019-01-15 NOTE — Progress Notes (Signed)
   Subjective: 2 Days Post-Op Procedure(s) (LRB): TOTAL KNEE ARTHROPLASTY (Right) Patient reports pain as moderate.   Patient seen in rounds for Dr. Theda Sers Patient is well, but has had some minor complaints of nausea. She has seen some improvement since taking Zofran. No SOB or chest pain. Voiding well. Positive flatus.    Objective: Vital signs in last 24 hours: Temp:  [97.6 F (36.4 C)-98.6 F (37 C)] 97.6 F (36.4 C) (03/15 0523) Pulse Rate:  [75-88] 85 (03/15 0523) Resp:  [16-18] 17 (03/15 0523) BP: (118-131)/(53-71) 118/70 (03/15 0523) SpO2:  [100 %] 100 % (03/15 0523)  Intake/Output from previous day:  Intake/Output Summary (Last 24 hours) at 01/15/2019 0844 Last data filed at 01/15/2019 0600 Gross per 24 hour  Intake 960 ml  Output 1900 ml  Net -940 ml     Labs: Recent Labs    01/14/19 0448 01/15/19 0338  HGB 10.4* 9.9*   Recent Labs    01/14/19 0448 01/15/19 0338  WBC 12.2* 11.7*  RBC 3.38* 3.28*  HCT 32.2* 31.2*  PLT 288 295   Recent Labs    01/14/19 0448  NA 139  K 3.4*  CL 107  CO2 23  BUN 13  CREATININE 0.62  GLUCOSE 133*  CALCIUM 8.4*    EXAM General - Patient is Alert and Oriented Extremity - Neurologically intact Intact pulses distally Dorsiflexion/Plantar flexion intact No cellulitis present Compartment soft Dressing/Incision - clean, dry, no drainage Motor Function - intact, moving foot and toes well on exam.   Past Medical History:  Diagnosis Date  . Allergy   . Arthritis    knees  . Complication of anesthesia   . PONV (postoperative nausea and vomiting)     Assessment/Plan: 2 Days Post-Op Procedure(s) (LRB): TOTAL KNEE ARTHROPLASTY (Right) Active Problems:   S/P knee replacement   Nausea & vomiting  Estimated body mass index is 38.76 kg/m as calculated from the following:   Height as of this encounter: 5\' 6"  (1.676 m).   Weight as of this encounter: 108.9 kg. Advance diet Up with therapy D/C IV fluids  DVT  Prophylaxis - Aspirin Weight-Bearing as tolerated   Continue therapy today. Plan for DC home today. Will have RN change aquacel dressing. Follow up in office in 2 weeks.  Ardeen Jourdain, PA-C Orthopaedic Surgery 01/15/2019, 8:44 AM

## 2019-01-15 NOTE — Discharge Instructions (Signed)
[address] [place], [place] [address] [phone]  TOTAL KNEE REPLACEMENT POSTOPERATIVE DIRECTIONS  Knee Rehabilitation, Guidelines Following Surgery  Results after knee surgery are often greatly improved when you follow the exercise, range of motion and muscle strengthening exercises prescribed by your doctor. Safety measures are also important to protect the knee from further injury. Any time any of these exercises cause you to have increased pain or swelling in your knee joint, decrease the amount until you are comfortable again and slowly increase them. If you have problems or questions, call your caregiver or physical therapist for advice.   HOME CARE INSTRUCTIONS  Remove items at home which could result in a fall. This includes throw rugs or furniture in walking pathways.   ICE to the affected knee every three hours for 30 minutes at a time and then as needed for pain and swelling.  Continue to use ice on the knee for pain and swelling from surgery. You may notice swelling that will progress down to the foot and ankle.  This is normal after surgery.  Elevate the leg when you are not up walking on it.    Continue to use the breathing machine which will help keep your temperature down.  It is common for your temperature to cycle up and down following surgery, especially at night when you are not up moving around and exerting yourself.  The breathing machine keeps your lungs expanded and your temperature down.  Do not place pillow under knee, focus on keeping the knee straight while resting  DIET You may resume your previous home diet once your are discharged from the hospital.  DRESSING / WOUND CARE / SHOWERING Keep the surgical dressing until follow up.  The dressing is water proof, so you can shower without any extra covering.  IF THE DRESSING FALLS OFF or the wound gets wet inside, change the dressing with sterile gauze.  Please use good hand washing techniques  before changing the dressing.  Do not use any lotions or creams on the incision until instructed by your surgeon.   You may start showering once you are discharged home but do not submerge the incision under water. Just pat the incision dry and apply a dry gauze dressing on daily. Change the surgical dressing daily and reapply a dry dressing each time.  ACTIVITY Walk with your walker as instructed. Use walker as long as suggested by your caregivers. Avoid periods of inactivity such as sitting longer than an hour when not asleep. This helps prevent blood clots.  You may resume a sexual relationship in one month or when given the OK by your doctor.  You may return to work once you are cleared by your doctor.  Do not drive a car for 6 weeks or until released by you surgeon.  Do not drive while taking narcotics.  WEIGHT BEARING Weight bearing as tolerated with assist device (walker, cane, etc) as directed, use it as long as suggested by your surgeon or therapist, typically at least 4-6 weeks.  POSTOPERATIVE CONSTIPATION PROTOCOL Constipation - defined medically as fewer than three stools per week and severe constipation as less than one stool per week.  One of the most common issues patients have following surgery is constipation.  Even if you have a regular bowel pattern at home, your normal regimen is likely to be disrupted due to multiple reasons following surgery.  Combination of anesthesia, postoperative narcotics, change in appetite and fluid intake all can affect  your bowels.  In order to avoid complications following surgery, here are some recommendations in order to help you during your recovery period.  Colace (docusate) - Pick up an over-the-counter form of Colace or another stool softener and take twice a day as long as you are requiring postoperative pain medications.  Take with a full glass of water daily.  If you experience loose stools or diarrhea, hold the colace until you stool  forms back up.  If your symptoms do not get better within 1 week or if they get worse, check with your doctor.  Dulcolax (bisacodyl) - Pick up over-the-counter and take as directed by the product packaging as needed to assist with the movement of your bowels.  Take with a full glass of water.  Use this product as needed if not relieved by Colace only.   MiraLax (polyethylene glycol) - Pick up over-the-counter to have on hand.  MiraLax is a solution that will increase the amount of water in your bowels to assist with bowel movements.  Take as directed and can mix with a glass of water, juice, soda, coffee, or tea.  Take if you go more than two days without a movement. Do not use MiraLax more than once per day. Call your doctor if you are still constipated or irregular after using this medication for 7 days in a row.  If you continue to have problems with postoperative constipation, please contact the office for further assistance and recommendations.  If you experience "the worst abdominal pain ever" or develop nausea or vomiting, please contact the office immediatly for further recommendations for treatment.  ITCHING  If you experience itching with your medications, try taking only a single pain pill, or even half a pain pill at a time.  You can also use Benadryl over the counter for itching or also to help with sleep.   TED HOSE STOCKINGS Wear the elastic stockings on both legs for three weeks following surgery during the day but you may remove then at night for sleeping.  MEDICATIONS See your medication summary on the After Visit Summary that the nursing staff will review with you prior to discharge.  You may have some home medications which will be placed on hold until you complete the course of blood thinner medication.  It is important for you to complete the blood thinner medication as prescribed by your surgeon.  Continue your approved medications as instructed at time of  discharge.  PRECAUTIONS If you experience chest pain or shortness of breath - call 911 immediately for transfer to the hospital emergency department.  If you develop a fever greater that 101 F, purulent drainage from wound, increased redness or drainage from wound, foul odor from the wound/dressing, or calf pain - CONTACT YOUR SURGEON.                                                   FOLLOW-UP APPOINTMENTS Make sure you keep all of your appointments after your operation with your surgeon and caregivers. You should call the office at the above phone number and make an appointment for approximately two weeks after the date of your surgery or on the date instructed by your surgeon outlined in the "After Visit Summary".   RANGE OF MOTION AND STRENGTHENING EXERCISES  Rehabilitation of the knee is important following a  knee injury or an operation. After just a few days of immobilization, the muscles of the thigh which control the knee become weakened and shrink (atrophy). Knee exercises are designed to build up the tone and strength of the thigh muscles and to improve knee motion. Often times heat used for twenty to thirty minutes before working out will loosen up your tissues and help with improving the range of motion but do not use heat for the first two weeks following surgery. These exercises can be done on a training (exercise) mat, on the floor, on a table or on a bed. Use what ever works the best and is most comfortable for you Knee exercises include:  Leg Lifts - While your knee is still immobilized in a splint or cast, you can do straight leg raises. Lift the leg to 60 degrees, hold for 3 sec, and slowly lower the leg. Repeat 10-20 times 2-3 times daily. Perform this exercise against resistance later as your knee gets better.  Quad and Hamstring Sets - Tighten up the muscle on the front of the thigh (Quad) and hold for 5-10 sec. Repeat this 10-20 times hourly. Hamstring sets are done by pushing the  foot backward against an object and holding for 5-10 sec. Repeat as with quad sets.   Leg Slides: Lying on your back, slowly slide your foot toward your buttocks, bending your knee up off the floor (only go as far as is comfortable). Then slowly slide your foot back down until your leg is flat on the floor again.  Angel Wings: Lying on your back spread your legs to the side as far apart as you can without causing discomfort.  A rehabilitation program following serious knee injuries can speed recovery and prevent re-injury in the future due to weakened muscles. Contact your doctor or a physical therapist for more information on knee rehabilitation.   IF YOU ARE TRANSFERRED TO A SKILLED REHAB FACILITY If the patient is transferred to a skilled rehab facility following release from the hospital, a list of the current medications will be sent to the facility for the patient to continue.  When discharged from the skilled rehab facility, please have the facility set up the patient's [place] prior to being released. Also, the skilled facility will be responsible for providing the patient with their medications at time of release from the facility to include their pain medication, the muscle relaxants, and their blood thinner medication. If the patient is still at the rehab facility at time of the two week follow up appointment, the skilled rehab facility will also need to assist the patient in arranging follow up appointment in our office and any transportation needs.  MAKE SURE YOU:  Understand these instructions.  Get help right away if you are not doing well or get worse.    Pick up stool softner and laxative for home use following surgery while on pain medications. Do not submerge incision under water. Please use good hand washing techniques while changing dressing each day. May shower starting three days after surgery. Please use a clean towel to pat the incision dry following  showers. Continue to use ice for pain and swelling after surgery. Do not use any lotions or creams on the incision until instructed by your surgeon.

## 2019-01-15 NOTE — Progress Notes (Signed)
Physical Therapy Treatment Patient Details Name: Stacie Webster MRN: [id number] DOB: October 04, 1971 Today's Date: 01/15/2019    History of Present Illness Pt s/p R TKR    PT Comments    Pt continues very cooperative and progressing to negotiate stairs but ltd by continued pain and nausea with activity.  Pt eager for dc home.  Follow Up Recommendations  Follow surgeon's recommendation for DC plan and follow-up therapies     Equipment Recommendations  None recommended by PT    Recommendations for Other Services       Precautions / Restrictions Precautions Precautions: Fall;Knee Restrictions Weight Bearing Restrictions: No RLE Weight Bearing: Weight bearing as tolerated    Mobility  Bed Mobility Overal bed mobility: Modified Independent Bed Mobility: Supine to Sit;Sit to Supine     Supine to sit: Modified independent (Device/Increase time) Sit to supine: Modified independent (Device/Increase time)      Transfers Overall transfer level: Needs assistance Equipment used: Rolling walker (2 wheeled) Transfers: Sit to/from Stand Sit to Stand: Supervision         General transfer comment: pt self-cues for LE management and use of UEs to self assist`  Ambulation/Gait Ambulation/Gait assistance: Min guard;Supervision Gait Distance (Feet): 100 Feet Assistive device: Rolling walker (2 wheeled) Gait Pattern/deviations: Step-to pattern;Decreased step length - right;Decreased step length - left;Shuffle;Decreased stance time - right;Antalgic;Trunk flexed Gait velocity: decr   General Gait Details: min cues for posture, position from RW and initial sequence   Stairs Stairs: Yes Stairs assistance: Min assist Stair Management: No rails;One rail Right;Step to pattern;Backwards;Forwards;With walker;With crutches Number of Stairs: 5 General stair comments: single step fwd and bkwd with RW, 3 steps with rail and crutch   Wheelchair Mobility    Modified Rankin (Stroke  Patients Only)       Balance Overall balance assessment: Mild deficits observed, not formally tested                                          Cognition Arousal/Alertness: Awake/alert Behavior During Therapy: WFL for tasks assessed/performed Overall Cognitive Status: Within Functional Limits for tasks assessed                                        Exercises Total Joint Exercises Ankle Circles/Pumps: AROM;Both;15 reps;Supine Quad Sets: AROM;Both;10 reps;Supine Heel Slides: AAROM;Right;15 reps;Supine Straight Leg Raises: AAROM;AROM;Right;Supine;15 reps Goniometric ROM: AAROM R knee -8 - 60    General Comments        Pertinent Vitals/Pain Pain Assessment: 0-10 Pain Score: 6  Pain Location: R knee  Pain Descriptors / Indicators: Aching;Sore Pain Intervention(s): Limited activity within patient's tolerance;Monitored during session;Premedicated before session;Ice applied    Home Living                      Prior Function            PT Goals (current goals can now be found in the care plan section) Acute Rehab PT Goals Patient Stated Goal: Regain IND PT Goal Formulation: With patient Time For Goal Achievement: 01/20/19 Potential to Achieve Goals: Good Progress towards PT goals: Progressing toward goals    Frequency    7X/week      PT Plan Current plan remains appropriate    Co-evaluation  AM-PAC PT "6 Clicks" Mobility   Outcome Measure  Help needed turning from your back to your side while in a flat bed without using bedrails?: A Little Help needed moving from lying on your back to sitting on the side of a flat bed without using bedrails?: A Little Help needed moving to and from a bed to a chair (including a wheelchair)?: A Little Help needed standing up from a chair using your arms (e.g., wheelchair or bedside chair)?: A Little Help needed to walk in hospital room?: A Little Help needed climbing  3-5 steps with a railing? : A Lot 6 Click Score: 17    End of Session Equipment Utilized During Treatment: Gait belt Activity Tolerance: Patient tolerated treatment well Patient left: in bed;with call bell/phone within reach Nurse Communication: Mobility status PT Visit Diagnosis: Difficulty in walking, not elsewhere classified (R26.2)     Time: 6378-5885 PT Time Calculation (min) (ACUTE ONLY): 47 min  Charges:  $Gait Training: 8-22 mins $Therapeutic Exercise: 8-22 mins                     [place] Pager [phone] Office [phone]    Stacie Webster 01/15/2019, 1:02 PM

## 2019-01-15 NOTE — Plan of Care (Signed)
Progressing well as pain is better controlled

## 2019-01-17 ENCOUNTER — Encounter: Payer: Self-pay | Admitting: Family Medicine

## 2019-01-17 ENCOUNTER — Encounter (HOSPITAL_COMMUNITY): Payer: Self-pay | Admitting: Specialist

## 2019-01-17 ENCOUNTER — Other Ambulatory Visit: Payer: Self-pay | Admitting: *Deleted

## 2019-01-18 NOTE — Patient Outreach (Signed)
[facility] Care Management  01/17/2019  Stacie Webster November 25, 1970 [id number]  Transition of care call   Referral received: 12/27/18 Initial outreach: 01/10/19 for pre-op call Insurance: [employer]   Subjective: Initial successful telephone call to patient's preferred number in order to complete transition of care assessment; 2 HIPAA identifiers verified. Explained purpose of call and completed transition of care assessment.  Stacie Webster states she is doing Ok except for significant muscle cramps in her right thigh and quadriceps. She says the prescribed Robaxin is not relieving the cramps adequately so she says she plans to call her surgeon tomorrow if the intensity of the cramps persist to see if she can try another muscle relaxant. She says she is tolerating her diet , denies bowel or bladder problems. Her husband is assisting with her recovery and providing transportation to her appointments . She says she had her first outpatient physical therapy appointment today.    Objective:  Stacie Webster had for right total knee arthroplasty on 01/13/19 at [facility]. Comorbidities include: obesity and back pain She was discharged to home on 01/15/19 without the need for home health services or DME as she borrowed the DME she would need prior to surgery.   Assessment:  Patient voices good understanding of all discharge instructions.  See transition of care flowsheet for assessment details.   Plan:  No ongoing care management needs identified so will close case to [facility] care management services and route successful outreach letter with [place] [facility] pamphlet and 24 Hour Nurse Line Magnet to [facility] clinical pool to be mailed to patient's home address.   Barrington Ellison RN,CCM,CDE [place] Management Coordinator Office Phone [phone] Office Fax  [phone]

## 2019-01-25 MED FILL — METHOCARBAMOL 500 MG TABLET: 500 | 16 days supply | Qty: 50 | Fill #0

## 2019-01-25 MED FILL — traMADol HCL 50 MG TABS: 50 | 7 days supply | Qty: 30 | Fill #0

## 2019-02-05 NOTE — Discharge Summary (Signed)
Physician Discharge Summary  Patient ID: Stacie Webster MRN: [id number] DOB/AGE: [birth year] 48 y.o.  Admit date: 01/13/2019 Discharge date: 01/15/2019  Admission Diagnoses: Knee OA  Discharge Diagnoses:  Active Problems:   S/P knee replacement   Nausea & vomiting   Discharged Condition: good  Hospital Course:  Stacie Webster is a 48 y.o. who was admitted to [facility]. They were brought to the operating room on 01/13/2019 and underwent Procedure(s): TOTAL KNEE ARTHROPLASTY.  Patient tolerated the procedure well and was later transferred to the recovery room and then to the orthopaedic floor for postoperative care.  They were given PO and IV analgesics for pain control following their surgery.  They were given 24 hours of postoperative antibiotics of  Anti-infectives (From admission, onward)   Start     Dose/Rate Route Frequency Ordered Stop   01/13/19 2000  vancomycin (VANCOCIN) 1,000 mg in sodium chloride 0.9 % 250 mL IVPB     1,000 mg 250 mL/hr over 60 Minutes Intravenous Every 12 hours 01/13/19 1153 01/13/19 2248   01/13/19 0600  vancomycin (VANCOCIN) IVPB 1000 mg/200 mL premix     1,000 mg 200 mL/hr over 60 Minutes Intravenous On call to O.R. 01/13/19 6468 01/13/19 0736     and started on DVT prophylaxis in the form of lovenox.   PT and OT were ordered for total joint protocol.  Discharge planning consulted to help with postop disposition and equipment needs.  Patient had a good night on the evening of surgery and started to get up OOB with therapy on day one.  Hemovac drain was pulled without difficulty.  Continued to work with therapy into day two.  Dressing was with normal limits.  The patient had progressed with therapy and meeting their goals. Patient was seen in rounds and was ready to go home.  Consults: n/a  Significant Diagnostic Studies: routine  Treatments: routine  Discharge Exam: Blood pressure 118/70, pulse 85, temperature 97.6 F (36.4 C),  temperature source Oral, resp. rate 17, height 5\' 6"  (1.676 m), weight 108.9 kg, last menstrual period 12/22/2018, SpO2 100 %.   Disposition:   Discharge Instructions    Call MD / Call 911   Complete by:  As directed    If you experience chest pain or shortness of breath, CALL 911 and be transported to the hospital emergency room.  If you develope a fever above 101 F, pus (white drainage) or increased drainage or redness at the wound, or calf pain, call your surgeon's office.   Call MD / Call 911   Complete by:  As directed    If you experience chest pain or shortness of breath, CALL 911 and be transported to the hospital emergency room.  If you develope a fever above 101 F, pus (white drainage) or increased drainage or redness at the wound, or calf pain, call your surgeon's office.   Constipation Prevention   Complete by:  As directed    Drink plenty of fluids.  Prune juice may be helpful.  You may use a stool softener, such as Colace (over the counter) 100 mg twice a day.  Use MiraLax (over the counter) for constipation as needed.   Constipation Prevention   Complete by:  As directed    Drink plenty of fluids.  Prune juice may be helpful.  You may use a stool softener, such as Colace (over the counter) 100 mg twice a day.  Use MiraLax (over the counter) for constipation as needed.  Diet - low sodium heart healthy   Complete by:  As directed    Diet - low sodium heart healthy   Complete by:  As directed    Discharge instructions   Complete by:  As directed    INSTRUCTIONS AFTER JOINT REPLACEMENT   Remove items at home which could result in a fall. This includes throw rugs or furniture in walking pathways ICE to the affected joint every three hours while awake for 30 minutes at a time, for at least the first 3-5 days, and then as needed for pain and swelling.  Continue to use ice for pain and swelling. You may notice swelling that will progress down to the foot and ankle.  This is normal  after surgery.  Elevate your leg when you are not up walking on it.   Continue to use the breathing machine you got in the hospital (incentive spirometer) which will help keep your temperature down.  It is common for your temperature to cycle up and down following surgery, especially at night when you are not up moving around and exerting yourself.  The breathing machine keeps your lungs expanded and your temperature down.   DIET:  As you were doing prior to hospitalization, we recommend a well-balanced diet.  DRESSING / WOUND CARE / SHOWERING  Keep the surgical dressing until follow up.  The dressing is water proof, so you can shower without any extra covering.  IF THE DRESSING FALLS OFF or the wound gets wet inside, change the dressing with sterile gauze.  Please use good hand washing techniques before changing the dressing.  Do not use any lotions or creams on the incision until instructed by your surgeon.    ACTIVITY  Increase activity slowly as tolerated, but follow the weight bearing instructions below.   No driving for 6 weeks or until further direction given by your physician.  You cannot drive while taking narcotics.  No lifting or carrying greater than 10 lbs. until further directed by your surgeon. Avoid periods of inactivity such as sitting longer than an hour when not asleep. This helps prevent blood clots.  You may return to work once you are authorized by your doctor.     WEIGHT BEARING   Weight bearing as tolerated with assist device (walker, cane, etc) as directed, use it as long as suggested by your surgeon or therapist, typically at least 4-6 weeks.   EXERCISES  Results after joint replacement surgery are often greatly improved when you follow the exercise, range of motion and muscle strengthening exercises prescribed by your doctor. Safety measures are also important to protect the joint from further injury. Any time any of these exercises cause you to have increased  pain or swelling, decrease what you are doing until you are comfortable again and then slowly increase them. If you have problems or questions, call your caregiver or physical therapist for advice.   Rehabilitation is important following a joint replacement. After just a few days of immobilization, the muscles of the leg can become weakened and shrink (atrophy).  These exercises are designed to build up the tone and strength of the thigh and leg muscles and to improve motion. Often times heat used for twenty to thirty minutes before working out will loosen up your tissues and help with improving the range of motion but do not use heat for the first two weeks following surgery (sometimes heat can increase post-operative swelling).   These exercises can be done on a  training (exercise) mat, on the floor, on a table or on a bed. Use whatever works the best and is most comfortable for you.    Use music or television while you are exercising so that the exercises are a pleasant break in your day. This will make your life better with the exercises acting as a break in your routine that you can look forward to.   Perform all exercises about fifteen times, three times per day or as directed.  You should exercise both the operative leg and the other leg as well.   Exercises include:   Quad Sets - Tighten up the muscle on the front of the thigh (Quad) and hold for 5-10 seconds.   Straight Leg Raises - With your knee straight (if you were given a brace, keep it on), lift the leg to 60 degrees, hold for 3 seconds, and slowly lower the leg.  Perform this exercise against resistance later as your leg gets stronger.  Leg Slides: Lying on your back, slowly slide your foot toward your buttocks, bending your knee up off the floor (only go as far as is comfortable). Then slowly slide your foot back down until your leg is flat on the floor again.  Angel Wings: Lying on your back spread your legs to the side as far apart as  you can without causing discomfort.  Hamstring Strength:  Lying on your back, push your heel against the floor with your leg straight by tightening up the muscles of your buttocks.  Repeat, but this time bend your knee to a comfortable angle, and push your heel against the floor.  You may put a pillow under the heel to make it more comfortable if necessary.   A rehabilitation program following joint replacement surgery can speed recovery and prevent re-injury in the future due to weakened muscles. Contact your doctor or a physical therapist for more information on knee rehabilitation.    CONSTIPATION  Constipation is defined medically as fewer than three stools per week and severe constipation as less than one stool per week.  Even if you have a regular bowel pattern at home, your normal regimen is likely to be disrupted due to multiple reasons following surgery.  Combination of anesthesia, postoperative narcotics, change in appetite and fluid intake all can affect your bowels.   YOU MUST use at least one of the following options; they are listed in order of increasing strength to get the job done.  They are all available over the counter, and you may need to use some, POSSIBLY even all of these options:    Drink plenty of fluids (prune juice may be helpful) and high fiber foods Colace 100 mg by mouth twice a day  Senokot for constipation as directed and as needed Dulcolax (bisacodyl), take with full glass of water  Miralax (polyethylene glycol) once or twice a day as needed.  If you have tried all these things and are unable to have a bowel movement in the first 3-4 days after surgery call either your surgeon or your primary doctor.    If you experience loose stools or diarrhea, hold the medications until you stool forms back up.  If your symptoms do not get better within 1 week or if they get worse, check with your doctor.  If you experience "the worst abdominal pain ever" or develop nausea or  vomiting, please contact the office immediately for further recommendations for treatment.   ITCHING:  If you experience itching  with your medications, try taking only a single pain pill, or even half a pain pill at a time.  You can also use Benadryl over the counter for itching or also to help with sleep.   TED HOSE STOCKINGS:  Use stockings on both legs until for at least 2 weeks or as directed by physician office. They may be removed at night for sleeping.  MEDICATIONS:  See your medication summary on the "After Visit Summary" that nursing will review with you.  You may have some home medications which will be placed on hold until you complete the course of blood thinner medication.  It is important for you to complete the blood thinner medication as prescribed.  PRECAUTIONS:  If you experience chest pain or shortness of breath - call 911 immediately for transfer to the hospital emergency department.   If you develop a fever greater that 101 F, purulent drainage from wound, increased redness or drainage from wound, foul odor from the wound/dressing, or calf pain - CONTACT YOUR SURGEON.                                                   FOLLOW-UP APPOINTMENTS:  If you do not already have a post-op appointment, please call the office for an appointment to be seen by your surgeon.  Guidelines for how soon to be seen are listed in your "After Visit Summary", but are typically between 1-4 weeks after surgery.  OTHER INSTRUCTIONS:   Knee Replacement:  Do not place pillow under knee, focus on keeping the knee straight while resting. CPM instructions: 0-90 degrees, 2 hours in the morning, 2 hours in the afternoon, and 2 hours in the evening. Place foam block, curve side up under heel at all times except when in CPM or when walking.  DO NOT modify, tear, cut, or change the foam block in any way.  MAKE SURE YOU:  Understand these instructions.  Get help right away if you are not doing well or get worse.     Thank you for letting us be a part of your medical care team.  It is a privilege we respect greatly.  We hope these instructions will help you stay on track for a fast and full recovery!   Discharge instructions   Complete by:  As directed    [address] [place], [place] [address] [phone]  TOTAL KNEE REPLACEMENT POSTOPERATIVE DIRECTIONS  Knee Rehabilitation, Guidelines Following Surgery  Results after knee surgery are often greatly improved when you follow the exercise, range of motion and muscle strengthening exercises prescribed by your doctor. Safety measures are also important to protect the knee from further injury. Any time any of these exercises cause you to have increased pain or swelling in your knee joint, decrease the amount until you are comfortable again and slowly increase them. If you have problems or questions, call your caregiver or physical therapist for advice.   HOME CARE INSTRUCTIONS  Remove items at home which could result in a fall. This includes throw rugs or furniture in walking pathways.  ICE to the affected knee every three hours for 30 minutes at a time and then as needed for pain and swelling.  Continue to use ice on the knee for pain and swelling from surgery. You may notice swelling that will progress down  to the foot and ankle.  This is normal after surgery.  Elevate the leg when you are not up walking on it.   Continue to use the breathing machine which will help keep your temperature down.  It is common for your temperature to cycle up and down following surgery, especially at night when you are not up moving around and exerting yourself.  The breathing machine keeps your lungs expanded and your temperature down. Do not place pillow under knee, focus on keeping the knee straight while resting  DIET You may resume your previous home diet once your are discharged from the hospital.  DRESSING / WOUND CARE / SHOWERING Keep the surgical  dressing until follow up.  The dressing is water proof, so you can shower without any extra covering.  IF THE DRESSING FALLS OFF or the wound gets wet inside, change the dressing with sterile gauze.  Please use good hand washing techniques before changing the dressing.  Do not use any lotions or creams on the incision until instructed by your surgeon.   You may start showering once you are discharged home but do not submerge the incision under water. Just pat the incision dry and apply a dry gauze dressing on daily. Change the surgical dressing daily and reapply a dry dressing each time.  ACTIVITY Walk with your walker as instructed. Use walker as long as suggested by your caregivers. Avoid periods of inactivity such as sitting longer than an hour when not asleep. This helps prevent blood clots.  You may resume a sexual relationship in one month or when given the OK by your doctor.  You may return to work once you are cleared by your doctor.  Do not drive a car for 6 weeks or until released by you surgeon.  Do not drive while taking narcotics.  WEIGHT BEARING Weight bearing as tolerated with assist device (walker, cane, etc) as directed, use it as long as suggested by your surgeon or therapist, typically at least 4-6 weeks.  POSTOPERATIVE CONSTIPATION PROTOCOL Constipation - defined medically as fewer than three stools per week and severe constipation as less than one stool per week.  One of the most common issues patients have following surgery is constipation.  Even if you have a regular bowel pattern at home, your normal regimen is likely to be disrupted due to multiple reasons following surgery.  Combination of anesthesia, postoperative narcotics, change in appetite and fluid intake all can affect your bowels.  In order to avoid complications following surgery, here are some recommendations in order to help you during your recovery period.  Colace (docusate) - Pick up an over-the-counter  form of Colace or another stool softener and take twice a day as long as you are requiring postoperative pain medications.  Take with a full glass of water daily.  If you experience loose stools or diarrhea, hold the colace until you stool forms back up.  If your symptoms do not get better within 1 week or if they get worse, check with your doctor.  Dulcolax (bisacodyl) - Pick up over-the-counter and take as directed by the product packaging as needed to assist with the movement of your bowels.  Take with a full glass of water.  Use this product as needed if not relieved by Colace only.   MiraLax (polyethylene glycol) - Pick up over-the-counter to have on hand.  MiraLax is a solution that will increase the amount of water in your bowels to assist with bowel movements.  Take as directed and can mix with a glass of water, juice, soda, coffee, or tea.  Take if you go more than two days without a movement. Do not use MiraLax more than once per day. Call your doctor if you are still constipated or irregular after using this medication for 7 days in a row.  If you continue to have problems with postoperative constipation, please contact the office for further assistance and recommendations.  If you experience "the worst abdominal pain ever" or develop nausea or vomiting, please contact the office immediatly for further recommendations for treatment.  ITCHING  If you experience itching with your medications, try taking only a single pain pill, or even half a pain pill at a time.  You can also use Benadryl over the counter for itching or also to help with sleep.   TED HOSE STOCKINGS Wear the elastic stockings on both legs for three weeks following surgery during the day but you may remove then at night for sleeping.  MEDICATIONS See your medication summary on the "After Visit Summary" that the nursing staff will review with you prior to discharge.  You may have some home medications which will be placed on  hold until you complete the course of blood thinner medication.  It is important for you to complete the blood thinner medication as prescribed by your surgeon.  Continue your approved medications as instructed at time of discharge.  PRECAUTIONS If you experience chest pain or shortness of breath - call 911 immediately for transfer to the hospital emergency department.  If you develop a fever greater that 101 F, purulent drainage from wound, increased redness or drainage from wound, foul odor from the wound/dressing, or calf pain - CONTACT YOUR SURGEON.                                                   FOLLOW-UP APPOINTMENTS Make sure you keep all of your appointments after your operation with your surgeon and caregivers. You should call the office at the above phone number and make an appointment for approximately two weeks after the date of your surgery or on the date instructed by your surgeon outlined in the "After Visit Summary".   RANGE OF MOTION AND STRENGTHENING EXERCISES  Rehabilitation of the knee is important following a knee injury or an operation. After just a few days of immobilization, the muscles of the thigh which control the knee become weakened and shrink (atrophy). Knee exercises are designed to build up the tone and strength of the thigh muscles and to improve knee motion. Often times heat used for twenty to thirty minutes before working out will loosen up your tissues and help with improving the range of motion but do not use heat for the first two weeks following surgery. These exercises can be done on a training (exercise) mat, on the floor, on a table or on a bed. Use what ever works the best and is most comfortable for you Knee exercises include:  Leg Lifts - While your knee is still immobilized in a splint or cast, you can do straight leg raises. Lift the leg to 60 degrees, hold for 3 sec, and slowly lower the leg. Repeat 10-20 times 2-3 times daily. Perform this exercise  against resistance later as your knee gets better.  Quad and Hamstring Sets -  Tighten up the muscle on the front of the thigh (Quad) and hold for 5-10 sec. Repeat this 10-20 times hourly. Hamstring sets are done by pushing the foot backward against an object and holding for 5-10 sec. Repeat as with quad sets.  Leg Slides: Lying on your back, slowly slide your foot toward your buttocks, bending your knee up off the floor (only go as far as is comfortable). Then slowly slide your foot back down until your leg is flat on the floor again. Angel Wings: Lying on your back spread your legs to the side as far apart as you can without causing discomfort.  A rehabilitation program following serious knee injuries can speed recovery and prevent re-injury in the future due to weakened muscles. Contact your doctor or a physical therapist for more information on knee rehabilitation.   IF YOU ARE TRANSFERRED TO A SKILLED REHAB FACILITY If the patient is transferred to a skilled rehab facility following release from the hospital, a list of the current medications will be sent to the facility for the patient to continue.  When discharged from the skilled rehab facility, please have the facility set up the patient's [place] prior to being released. Also, the skilled facility will be responsible for providing the patient with their medications at time of release from the facility to include their pain medication, the muscle relaxants, and their blood thinner medication. If the patient is still at the rehab facility at time of the two week follow up appointment, the skilled rehab facility will also need to assist the patient in arranging follow up appointment in our office and any transportation needs.  MAKE SURE YOU:  Understand these instructions.  Get help right away if you are not doing well or get worse.    Pick up stool softner and laxative for home use following surgery while on pain  medications. Do not submerge incision under water. Please use good hand washing techniques while changing dressing each day. May shower starting three days after surgery. Please use a clean towel to pat the incision dry following showers. Continue to use ice for pain and swelling after surgery. Do not use any lotions or creams on the incision until instructed by your surgeon.   Increase activity slowly as tolerated   Complete by:  As directed    Increase activity slowly as tolerated   Complete by:  As directed      Allergies as of 01/15/2019      Reactions   Penicillins Hives, Shortness Of Breath   Did it involve swelling of the face/tongue/throat, SOB, or low BP? Yes Did it involve sudden or severe rash/hives, skin peeling, or any reaction on the inside of your mouth or nose? Yes Did you need to seek medical attention at a hospital or doctor's office? Yes When did it last happen?20 years ago If all above answers are "NO", may proceed with cephalosporin use.   Ciprofloxacin Other (See Comments)   Severe muscle weakness   Meloxicam Other (See Comments)   Mouth ulcers   Quinolones Other (See Comments)   Severe muscle weakness      Medication List    STOP taking these medications   celecoxib 200 MG capsule Commonly known as:  CELEBREX   traMADol 50 MG tablet Commonly known as:  ULTRAM     TAKE these medications   aspirin EC 325 MG tablet Take 1 tablet (325 mg total) by mouth 2 (two) times daily.  HYDROcodone-acetaminophen 5-325 MG tablet Commonly known as:  NORCO/VICODIN Take 1-2 tablets by mouth every 6 (six) hours as needed for moderate pain.   methocarbamol 500 MG tablet Commonly known as:  ROBAXIN Take 1 tablet (500 mg total) by mouth every 8 (eight) hours as needed for muscle spasms.   ondansetron 4 MG tablet Commonly known as:  ZOFRAN Take 1 tablet (4 mg total) by mouth every 6 (six) hours as needed for nausea.      Follow-up Information    Sydnee Cabal, MD. Schedule an appointment as soon as possible for a visit in 2 week(s).   Specialty:  Orthopedic Surgery Contact information: [address] [place] [address] [phone]           Signed: Lajean Manes 02/05/2019, 8:48 AM

## 2019-02-06 MED FILL — FLUOROURACIL 5 % CREA: 5 | 15 days supply | Qty: 40 | Fill #0

## 2019-02-22 MED FILL — traMADol HCL 50 MG TABS: 50 | 7 days supply | Qty: 30 | Fill #0

## 2019-03-02 MED FILL — CELECOXIB 200 MG CAP: 200 | 30 days supply | Qty: 30 | Fill #0

## 2019-03-20 ENCOUNTER — Encounter: Payer: No Typology Code available for payment source | Admitting: Family Medicine

## 2019-03-24 MED FILL — CLINDAMYCIN HCL 300 MG CAPS: 300 | 5 days supply | Qty: 20 | Fill #0

## 2019-03-29 ENCOUNTER — Encounter: Payer: No Typology Code available for payment source | Attending: Family Medicine | Admitting: Registered"

## 2019-03-29 ENCOUNTER — Other Ambulatory Visit: Payer: Self-pay

## 2019-03-29 DIAGNOSIS — Z713 Dietary counseling and surveillance: Secondary | ICD-10-CM | POA: Insufficient documentation

## 2019-03-29 MED FILL — CELECOXIB 200 MG CAP: 200 | 30 days supply | Qty: 30 | Fill #1

## 2019-03-29 NOTE — Patient Instructions (Signed)
Work on [employer] and making enough for leftovers.

## 2019-03-29 NOTE — Progress Notes (Signed)
[employer] employee visit #1 of 3  Appt start time: 1100 end time:  1200.  Assessment:  Primary concerns today: Insurance requirement. Employee states she thinks it isn't healthy to be overweight, and she wants to prevent T2DM d/t family history -her dad and father's father have T2DM, diagnosed later in life. Per chart Employee's A1c is 4.9%  Employee stated eating habits: Fast eater (spouse and son are as well) Eats while doing other things such as getting ready for work, commuting between different jobs, Social research officer, government. Will eat what people bring into work, doughnuts are not especially tempting, but will eat cookies. Eats when bored, mindless eating  Dinner usually is 1/2 plate starch, 1/4 non-starchy veggies, and protein, fish every other week - likes tuna, shrimp, white fish, not salmon.  Husband and son home now, cooking a lot more food. They are losing weight, but she is not.   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 Sleep: at least 8 hrs, likes her sleep Stress: 3/10 Energy: 7/10  MEDICATIONS: reviewed   DIETARY INTAKE:  24-hr recall:  B ( AM): [place] yogurt, fruit OR PB & banana OR hard boiled eggs  Snk ( AM): granola bar OR string cheese OR whatever people bring into work L ( PM): sandwich, chips  Snk ( PM): granola bar OR trail mix OR cheeseits OR fruit D ( PM): protein, veg, starch Snk ( PM): none OR popcorn OR ice cream Beverages: water, 1 diet soda   PHYSICAL ACTIVITY: walk 30 min 5x/week Knee surgery PT, was playing tennis 2-3x/week, after knee heals plans to play again  Progress Towards Goal(s):  New goal.   Nutrition Education. MyPlate. Hormonal control over weight, focus on behaviors goals instead of weight to keep focus on health.  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  Handouts given during visit include:  MyPlate  Balanced Snack sheet  Salad ideas (Mix it Up)  Anatomy of a Grain Bowl  Barriers to learning/adherence to lifestyle change:  none  Demonstrated degree of understanding via:  Teach Back   Monitoring/Evaluation:  Dietary intake, exercise, meal balance, and body weight in 4 week(s).

## 2019-05-08 ENCOUNTER — Telehealth: Payer: Self-pay | Admitting: Family Medicine

## 2019-05-08 ENCOUNTER — Encounter: Payer: Self-pay | Admitting: Family Medicine

## 2019-05-08 DIAGNOSIS — Z01419 Encounter for gynecological examination (general) (routine) without abnormal findings: Secondary | ICD-10-CM

## 2019-05-08 NOTE — Telephone Encounter (Signed)
Pt with questions regarding covid antibody testing/accuracy.  Referred to [place].

## 2019-05-15 MED FILL — CELECOXIB 200 MG CAP: 200 | 30 days supply | Qty: 30 | Fill #2

## 2019-05-17 ENCOUNTER — Other Ambulatory Visit: Payer: Self-pay

## 2019-05-17 ENCOUNTER — Encounter: Payer: No Typology Code available for payment source | Attending: Family Medicine | Admitting: Registered"

## 2019-05-17 DIAGNOSIS — Z713 Dietary counseling and surveillance: Secondary | ICD-10-CM

## 2019-05-17 NOTE — Progress Notes (Signed)
[facility] employee visit #2 of 3  Appt start time: 7517 end time: 1555  Assessment:  Primary concerns today: Insurance requirement.  Employee states she found the handout 'Anatomy of a [employer]' helpful, sometimes her family has these bowls 2x week.  Employee states a strategy to address her fast eating style is to take less at first then get second helping if still hungry.  Employee asked for ideas for getting more fluids. With wearing mask now it make drinking enough water more difficult. We didn't cover this much today.  Employee states mindless eating isn't as bad since she has been able to go back to work.  MEDICATIONS: reviewed   DIETARY INTAKE: not assessed this visit  24-hr recall:  B ( AM):  Snk ( AM):  L ( PM):   Snk ( PM):  D ( PM):  Snk ( PM):  Beverages:    PHYSICAL ACTIVITY: not assessed  Progress Towards Goal(s):  In progress.   Nutrition Education. Mindful eating, enhancing experience with spices. Lack of research on foods/teas that increase metabolism.   Previous Goals: Work on Adult nurse and cooking enough for [employer]. ~75%   New Goals: Consider looking into the [place] site for more ideas Continue with meal prepping Continue with strategy starting with smaller serving sizes then going back for seconds if hungry. Read through mindfulness handout  Handouts given during visit include:  Mindfulness makes people make healthier choices  Barriers to learning/adherence to lifestyle change: none  Demonstrated degree of understanding via:  Teach Back   Monitoring/Evaluation:  Dietary intake, exercise, meal balance, and body weight in 4 week(s).

## 2019-05-17 NOTE — Patient Instructions (Signed)
Great job on trying new recipes. Consider looking into the [place] site for more ideas Continue with meal prepping Continue with strategy starting with smaller serving sizes then going back for seconds if hungry. Read through mindfulness handout

## 2019-05-18 ENCOUNTER — Encounter: Payer: Self-pay | Admitting: Registered"

## 2019-06-19 MED FILL — CELECOXIB 200 MG CAP: 200 | 30 days supply | Qty: 30 | Fill #3

## 2019-06-28 ENCOUNTER — Encounter: Payer: No Typology Code available for payment source | Attending: Family Medicine | Admitting: Registered"

## 2019-06-28 ENCOUNTER — Other Ambulatory Visit: Payer: Self-pay

## 2019-06-28 ENCOUNTER — Encounter: Payer: Self-pay | Admitting: Registered"

## 2019-06-28 DIAGNOSIS — Z713 Dietary counseling and surveillance: Secondary | ICD-10-CM | POA: Insufficient documentation

## 2019-06-28 NOTE — Progress Notes (Signed)
[facility] employee visit #3 of 3  Appt start time: [id number] end time: 1200  Assessment:  Primary concerns today: Insurance requirement. Employee confirmed that our office will notify [facility]W that she has completed her 3 visits.  Employee states she tried new Optician, dispensing states she continues to take less at first then get second helping if still hungry. Employee has also started putting food away after serving up plates to help save it for later  Hydration: Employee states she is being more mindful about drinking more, she will look up the 22 ideas for drinking more water on Self website (excluding tips on eating more salty snacks and leaving water bottles in the car)  Mindful eating: doesn't have as much snacky food since son went back to college.  MEDICATIONS: reviewed   DIETARY INTAKE: not assessed this visit  24-hr recall:  B ( AM):  Snk ( AM):  L ( PM):   Snk ( PM):  D ( PM):  Snk ( PM):  Beverages:    PHYSICAL ACTIVITY: not assessed  Progress Towards Goal(s):  In progress.   Nutrition Education. Discussed ways to get more water, checked-in with meal prepping,   Previous Goals: Consider looking into the [place] site for more ideas: Employee continues to cook at home and using seasoning ideas. Continue with strategy starting with smaller serving sizes then going back for seconds if hungry. Employee states she also puts food away after serving up plates. Read through mindfulness handout - read after last visit, was familiar with concepts.  New Goals: none  Handouts given during visit include:  none  Barriers to learning/adherence to lifestyle change: none  Demonstrated degree of understanding via:  Teach Back   Monitoring/Evaluation:  Dietary intake, exercise, meal balance, and body weight prn.

## 2019-07-25 MED FILL — CELECOXIB 200 MG CAP: 200 | 30 days supply | Qty: 30 | Fill #0

## 2019-07-26 LAB — HM MAMMOGRAPHY: HM Mammogram: NORMAL (ref 0–4)

## 2019-07-26 LAB — HM PAP SMEAR

## 2019-07-27 ENCOUNTER — Encounter: Payer: Self-pay | Admitting: General Practice

## 2019-08-18 MED FILL — CELECOXIB 200 MG CAP: 200 | 30 days supply | Qty: 30 | Fill #1

## 2019-10-02 MED FILL — CELECOXIB 200 MG CAP: 200 | 30 days supply | Qty: 30 | Fill #2

## 2019-11-07 MED FILL — CELECOXIB 200 MG CAP: 200 | 30 days supply | Qty: 30 | Fill #3

## 2019-11-13 ENCOUNTER — Other Ambulatory Visit: Payer: No Typology Code available for payment source

## 2020-04-02 ENCOUNTER — Encounter: Payer: Self-pay | Admitting: Family Medicine

## 2020-04-02 DIAGNOSIS — L989 Disorder of the skin and subcutaneous tissue, unspecified: Secondary | ICD-10-CM

## 2020-04-06 ENCOUNTER — Ambulatory Visit: Payer: No Typology Code available for payment source | Attending: Internal Medicine

## 2020-04-06 DIAGNOSIS — Z23 Encounter for immunization: Secondary | ICD-10-CM

## 2020-04-06 NOTE — Progress Notes (Signed)
° °  Covid-19 Vaccination Clinic  Name:  ANNAIS CRAFTS    MRN: [id number] DOB: 06-Nov-1970  04/06/2020  Stacie Webster was observed post Covid-19 immunization for 15 minutes without incident. She was provided with Vaccine Information Sheet and instruction to access the V-Safe system.   Stacie Webster was instructed to call 911 with any severe reactions post vaccine:  Difficulty breathing   Swelling of face and throat   A fast heartbeat   A bad rash all over body   Dizziness and weakness   Immunizations Administered    Name Date Dose VIS Date Route   JANSSEN COVID-19 VACCINE 04/06/2020 10:16 AM 0.5 mL 12/30/2019 Intramuscular   Manufacturer: [employer]   Lot: [id number]   [place]: [id number]

## 2020-04-08 ENCOUNTER — Encounter: Payer: Self-pay | Admitting: Family Medicine

## 2020-04-08 ENCOUNTER — Other Ambulatory Visit: Payer: Self-pay

## 2020-04-08 ENCOUNTER — Ambulatory Visit (INDEPENDENT_AMBULATORY_CARE_PROVIDER_SITE_OTHER): Payer: No Typology Code available for payment source | Admitting: Family Medicine

## 2020-04-08 VITALS — BP 120/79 | HR 81 | Temp 98.0°F | Resp 17 | Ht 66.0 in | Wt 205.1 lb

## 2020-04-08 DIAGNOSIS — Z0000 Encounter for general adult medical examination without abnormal findings: Secondary | ICD-10-CM | POA: Diagnosis not present

## 2020-04-08 DIAGNOSIS — E669 Obesity, unspecified: Secondary | ICD-10-CM | POA: Diagnosis not present

## 2020-04-08 NOTE — Patient Instructions (Signed)
Follow up in 1 year or as needed We'll notify you of your lab results and make any changes if needed Continue to work on healthy diet and regular exercise- you're doing great! Call with any questions or concerns Have a great summer!!!

## 2020-04-08 NOTE — Progress Notes (Signed)
   Subjective:    Patient ID: Stacie Webster, female    DOB: 08-Jun-1971, 49 y.o.   MRN: [id number]  HPI CPE- UTD on pap, mammo, colonoscopy, immunizations.  Pt is down 30 lb since last visit.     Review of Systems Patient reports no vision/ hearing changes, adenopathy,fever, persistant/recurrent hoarseness , swallowing issues, chest pain, palpitations, edema, persistant/recurrent cough, hemoptysis, dyspnea (rest/exertional/paroxysmal nocturnal), gastrointestinal bleeding (melena, rectal bleeding), abdominal pain, significant heartburn, bowel changes, GU symptoms (dysuria, hematuria, incontinence), Gyn symptoms (abnormal  bleeding, pain),  syncope, focal weakness, memory loss, numbness & tingling, skin/hair/nail changes, abnormal bruising or bleeding, anxiety, or depression.   This visit occurred during the SARS-CoV-2 public health emergency.  Safety protocols were in place, including screening questions prior to the visit, additional usage of staff PPE, and extensive cleaning of exam room while observing appropriate contact time as indicated for disinfecting solutions.       Objective:   Physical Exam General Appearance:    Alert, cooperative, no distress, appears stated age  Head:    Normocephalic, without obvious abnormality, atraumatic  Eyes:    PERRL, conjunctiva/corneas clear, EOM's intact, fundi    benign, both eyes  Ears:    Normal TM's and external ear canals, both ears  Nose:   Deferred due to COVID  Throat:   Neck:   Supple, symmetrical, trachea midline, no adenopathy;    Thyroid: no enlargement/tenderness/nodules  Back:     Symmetric, no curvature, ROM normal, no CVA tenderness  Lungs:     Clear to auscultation bilaterally, respirations unlabored  Chest Wall:    No tenderness or deformity   Heart:    Regular rate and rhythm, S1 and S2 normal, no murmur, rub   or gallop  Breast Exam:    Deferred to GYN  Abdomen:     Soft, non-tender, bowel sounds active all four quadrants,      no masses, no organomegaly  Genitalia:    Deferred to GYN  Rectal:    Extremities:   Extremities normal, atraumatic, no cyanosis or edema  Pulses:   2+ and symmetric all extremities  Skin:   Skin color, texture, turgor normal, no rashes or lesions  Lymph nodes:   Cervical, supraclavicular, and axillary nodes normal  Neurologic:   CNII-XII intact, normal strength, sensation and reflexes    throughout          Assessment & Plan:

## 2020-04-08 NOTE — Assessment & Plan Note (Signed)
Pt's PE WNL w/ exception of obesity.  UTD on GYN, colonoscopy, immunizations.  Check labs.  Anticipatory guidance provided.  

## 2020-04-08 NOTE — Assessment & Plan Note (Signed)
Pt has lost 30 lbs!  Applauded her efforts.  Check labs to risk stratify.  Will follow.

## 2020-04-09 ENCOUNTER — Telehealth: Payer: Self-pay | Admitting: Family Medicine

## 2020-04-09 LAB — CBC WITH DIFFERENTIAL/PLATELET
Basophils Absolute: 0.1 10*3/uL (ref 0.0–0.1)
Basophils Relative: 1.8 % (ref 0.0–3.0)
Eosinophils Absolute: 0.1 10*3/uL (ref 0.0–0.7)
Eosinophils Relative: 1.2 % (ref 0.0–5.0)
HCT: 39.4 % (ref 36.0–46.0)
Hemoglobin: 13.8 g/dL (ref 12.0–15.0)
Lymphocytes Relative: 26.3 % (ref 12.0–46.0)
Lymphs Abs: 1.2 10*3/uL (ref 0.7–4.0)
MCHC: 35.2 g/dL (ref 30.0–36.0)
MCV: 90.8 fl (ref 78.0–100.0)
Monocytes Absolute: 0.8 10*3/uL (ref 0.1–1.0)
Monocytes Relative: 17.7 % — ABNORMAL HIGH (ref 3.0–12.0)
Neutro Abs: 2.5 10*3/uL (ref 1.4–7.7)
Neutrophils Relative %: 53 % (ref 43.0–77.0)
Platelets: 329 10*3/uL (ref 150.0–400.0)
RBC: 4.34 Mil/uL (ref 3.87–5.11)
RDW: 13.6 % (ref 11.5–15.5)
WBC: 4.7 10*3/uL (ref 4.0–10.5)

## 2020-04-09 LAB — BASIC METABOLIC PANEL
BUN: 17 mg/dL (ref 6–23)
CO2: 26 mEq/L (ref 19–32)
Calcium: 9.3 mg/dL (ref 8.4–10.5)
Chloride: 100 mEq/L (ref 96–112)
Creatinine, Ser: 0.75 mg/dL (ref 0.40–1.20)
GFR: 82.04 mL/min (ref >60.00)
Glucose, Bld: 75 mg/dL (ref 70–99)
Potassium: 4.3 mEq/L (ref 3.5–5.1)
Sodium: 135 mEq/L (ref 135–145)

## 2020-04-09 LAB — LIPID PANEL
Cholesterol: 165 mg/dL (ref 0–200)
HDL: 62.9 mg/dL (ref >39.00)
LDL Cholesterol: 74 mg/dL (ref 0–99)
NonHDL: 102.02
Total CHOL/HDL Ratio: 3
Triglycerides: 141 mg/dL (ref 0.0–149.0)
VLDL: 28.2 mg/dL (ref 0.0–40.0)

## 2020-04-09 LAB — HEPATIC FUNCTION PANEL
ALT: 11 U/L (ref 0–35)
AST: 15 U/L (ref 0–37)
Albumin: 4.6 g/dL (ref 3.5–5.2)
Alkaline Phosphatase: 47 U/L (ref 39–117)
Bilirubin, Direct: 0.1 mg/dL (ref 0.0–0.3)
Total Bilirubin: 0.4 mg/dL (ref 0.2–1.2)
Total Protein: 7.3 g/dL (ref 6.0–8.3)

## 2020-04-09 LAB — TSH: TSH: 1.17 u[IU]/mL (ref 0.35–4.50)

## 2020-04-09 NOTE — Telephone Encounter (Signed)
Patient has called back in regard to lab results.    I have given patient Dr. Virgil Benedict response on labs.  Patient understood.  Stated that she also had seen on my chart but really appreciated the follow up call.

## 2020-11-03 ENCOUNTER — Encounter: Payer: Self-pay | Admitting: Family Medicine

## 2021-04-30 ENCOUNTER — Encounter: Payer: Self-pay | Admitting: *Deleted

## 2021-07-08 ENCOUNTER — Other Ambulatory Visit: Payer: Self-pay | Admitting: Family

## 2021-07-08 ENCOUNTER — Encounter: Payer: Self-pay | Admitting: Family Medicine

## 2021-07-08 DIAGNOSIS — M170 Bilateral primary osteoarthritis of knee: Secondary | ICD-10-CM

## 2021-07-10 ENCOUNTER — Other Ambulatory Visit: Payer: Self-pay

## 2021-07-10 DIAGNOSIS — M170 Bilateral primary osteoarthritis of knee: Secondary | ICD-10-CM

## 2022-05-29 ENCOUNTER — Encounter: Payer: Self-pay | Admitting: Family Medicine

## 2022-10-21 LAB — HM MAMMOGRAPHY

## 2022-10-22 LAB — HM PAP SMEAR

## 2022-10-22 LAB — LAB REPORT - SCANNED: A1c: 5.1

## 2022-12-30 ENCOUNTER — Ambulatory Visit (INDEPENDENT_AMBULATORY_CARE_PROVIDER_SITE_OTHER): Payer: 59 | Admitting: Family Medicine

## 2022-12-30 ENCOUNTER — Encounter: Payer: Self-pay | Admitting: Family Medicine

## 2022-12-30 VITALS — BP 118/80 | HR 79 | Temp 98.0°F | Ht 64.5 in | Wt 224.0 lb

## 2022-12-30 DIAGNOSIS — Z0000 Encounter for general adult medical examination without abnormal findings: Secondary | ICD-10-CM

## 2022-12-30 DIAGNOSIS — E669 Obesity, unspecified: Secondary | ICD-10-CM | POA: Diagnosis not present

## 2022-12-30 NOTE — Progress Notes (Signed)
   Subjective:    Patient ID: Stacie Webster, female    DOB: [birth year], 52 y.o.   MRN: [id number]  HPI CPE- Pt reports UTD on mammo and pap at [facility].  UTD on colonoscopy, Tdap, flu.  Patient Care Team    Relationship Specialty Notifications Start End  Midge Minium, MD PCP - General   10/15/10    Comment: Adline Mango, [facility]    12/30/22    Comment: Dr Gertie Fey    Health Maintenance  Topic Date Due   MAMMOGRAM  07/25/2020   COVID-19 Vaccine (2 - 2023-24 season) 07/03/2022   PAP SMEAR-Modifier  07/25/2022   Zoster Vaccines- Shingrix (1 of 2) 02/24/2023 (Originally 12/27/2020)   Hepatitis C Screening  12/31/2023 (Originally 12/27/1988)   HIV Screening  12/31/2023 (Originally 12/27/1985)   COLONOSCOPY (Pts 45-27yr Insurance coverage will need to be confirmed)  03/09/2028   DTaP/Tdap/Td (2 - Td or Tdap) 08/30/2028   INFLUENZA VACCINE  Completed   HPV VACCINES  Aged Out      Review of Systems Patient reports no vision/ hearing changes, adenopathy,fever, persistant/recurrent hoarseness , swallowing issues, chest pain, palpitations, edema, persistant/recurrent cough, hemoptysis, dyspnea (rest/exertional/paroxysmal nocturnal), gastrointestinal bleeding (melena, rectal bleeding), abdominal pain, significant heartburn, bowel changes, GU symptoms (dysuria, hematuria, incontinence), Gyn symptoms (abnormal  bleeding, pain),  syncope, focal weakness, memory loss, numbness & tingling, skin/hair/nail changes, abnormal bruising or bleeding, anxiety, or depression.   + 20 lb weight gain    Objective:   Physical Exam General Appearance:    Alert, cooperative, no distress, appears stated age, obese  Head:    Normocephalic, without obvious abnormality, atraumatic  Eyes:    PERRL, conjunctiva/corneas clear, EOM's intact both eyes  Ears:    Normal TM's and external ear canals, both ears  Nose:   Nares normal, septum midline, mucosa normal, no drainage    or  sinus tenderness  Throat:   Lips, mucosa, and tongue normal; teeth and gums normal  Neck:   Supple, symmetrical, trachea midline, no adenopathy;    Thyroid: no enlargement/tenderness/nodules  Back:     Symmetric, no curvature, ROM normal, no CVA tenderness  Lungs:     Clear to auscultation bilaterally, respirations unlabored  Chest Wall:    No tenderness or deformity   Heart:    Regular rate and rhythm, S1 and S2 normal, no murmur, rub   or gallop  Breast Exam:    Deferred to GYN  Abdomen:     Soft, non-tender, bowel sounds active all four quadrants,    no masses, no organomegaly  Genitalia:    Deferred to GYN  Rectal:    Extremities:   Extremities normal, atraumatic, no cyanosis or edema  Pulses:   2+ and symmetric all extremities  Skin:   Skin color, texture, turgor normal, no rashes or lesions  Lymph nodes:   Cervical, supraclavicular, and axillary nodes normal  Neurologic:   CNII-XII intact, normal strength, sensation and reflexes    throughout          Assessment & Plan:

## 2022-12-30 NOTE — Assessment & Plan Note (Signed)
Pt's PE WNL w/ exception of BMI.  UTD on mammo, pap, colonoscopy, Tdap, flu.  Reports she had labs done at GYN- will attempt to get those.  No need to repeat.  Anticipatory guidance provided.

## 2022-12-30 NOTE — Assessment & Plan Note (Signed)
Deteriorated.  Pt has gained 20 lbs since last visit.  Encouraged low carb diet and regular exercise.  Will follow.

## 2022-12-30 NOTE — Patient Instructions (Signed)
Follow up in 1 year or as needed No need to repeat your labs today- we'll track those down Continue to work on healthy diet and regular exercise- you can do it!! Call with any questions or concerns Stay Safe!  Stay Healthy! Happy [employer] Birthday!!!

## 2023-01-06 DIAGNOSIS — M1712 Unilateral primary osteoarthritis, left knee: Secondary | ICD-10-CM | POA: Diagnosis not present

## 2023-03-03 DIAGNOSIS — M1712 Unilateral primary osteoarthritis, left knee: Secondary | ICD-10-CM | POA: Diagnosis not present

## 2023-03-10 DIAGNOSIS — Z713 Dietary counseling and surveillance: Secondary | ICD-10-CM | POA: Diagnosis not present

## 2023-03-10 DIAGNOSIS — Z6836 Body mass index (BMI) 36.0-36.9, adult: Secondary | ICD-10-CM | POA: Diagnosis not present

## 2023-03-10 DIAGNOSIS — M1712 Unilateral primary osteoarthritis, left knee: Secondary | ICD-10-CM | POA: Diagnosis not present

## 2023-03-10 DIAGNOSIS — R638 Other symptoms and signs concerning food and fluid intake: Secondary | ICD-10-CM | POA: Diagnosis not present

## 2023-03-17 DIAGNOSIS — M1712 Unilateral primary osteoarthritis, left knee: Secondary | ICD-10-CM | POA: Diagnosis not present

## 2023-05-03 LAB — HM MAMMOGRAPHY

## 2023-06-28 ENCOUNTER — Other Ambulatory Visit (HOSPITAL_BASED_OUTPATIENT_CLINIC_OR_DEPARTMENT_OTHER): Payer: Self-pay

## 2023-06-28 ENCOUNTER — Telehealth: Payer: Self-pay | Admitting: Family Medicine

## 2023-06-28 DIAGNOSIS — M1712 Unilateral primary osteoarthritis, left knee: Secondary | ICD-10-CM | POA: Diagnosis not present

## 2023-06-28 MED ORDER — CELECOXIB 200 MG PO CAPS
200.0000 mg | ORAL_CAPSULE | Freq: Every day | ORAL | 3 refills | Status: DC
  Filled 2023-06-28: qty 30, 30d supply, fill #0
  Filled 2023-08-02: qty 30, 30d supply, fill #1
  Filled 2023-08-23 – 2023-09-01 (×3): qty 30, 30d supply, fill #2
  Filled 2023-11-22: qty 30, 30d supply, fill #3

## 2023-06-28 NOTE — Telephone Encounter (Signed)
Pt has an apt on 07/07/23  for her surgical clearance  she needs an EKG and labs . [facility] form in my yellow folder to the left of my laptop

## 2023-06-28 NOTE — Telephone Encounter (Signed)
 Received forms from [facility] Printed & placed in provider bin

## 2023-07-02 ENCOUNTER — Other Ambulatory Visit (HOSPITAL_BASED_OUTPATIENT_CLINIC_OR_DEPARTMENT_OTHER): Payer: Self-pay

## 2023-07-07 ENCOUNTER — Encounter: Payer: Self-pay | Admitting: Family Medicine

## 2023-07-07 ENCOUNTER — Ambulatory Visit (INDEPENDENT_AMBULATORY_CARE_PROVIDER_SITE_OTHER): Payer: 59 | Admitting: Family Medicine

## 2023-07-07 VITALS — BP 118/82 | HR 82 | Temp 97.9°F | Resp 18 | Ht 64.5 in | Wt 208.0 lb

## 2023-07-07 DIAGNOSIS — Z01818 Encounter for other preprocedural examination: Secondary | ICD-10-CM

## 2023-07-07 NOTE — Patient Instructions (Signed)
Follow up as needed or as scheduled Keep up the good work on healthy diet and regular exercise Call with any questions or concerns GOOD LUCK WITH SURGERY!!!

## 2023-07-07 NOTE — Progress Notes (Signed)
Subjective:    Stacie Webster is a 52 y.o. female who presents to the office today for a preoperative consultation at the request of surgeon Swintek who plans on performing L total knee on  TBD     . This consultation is requested for the specific conditions prompting preoperative evaluation (i.e. because of potential affect on operative risk): obesity. Planned anesthesia: spinal. The patient has the following known anesthesia issues: past general anesthesia with complications (vomiting). Patients bleeding risk: no recent abnormal bleeding, no remote history of abnormal bleeding, and no use of Ca-channel blockers. Patient does not have objections to receiving blood products if needed.  The following portions of the patient's history were reviewed and updated as appropriate: allergies, current medications, past family history, past medical history, past social history, past surgical history, and problem list.  Review of Systems A comprehensive review of systems was negative.    Objective:    BP 118/82   Pulse 82   Temp 97.9 F (36.6 C) (Temporal)   Resp 18   Ht 5' 4.5" (1.638 m)   Wt 208 lb (94.3 kg)   SpO2 100%   BMI 35.15 kg/m   General Appearance:    Alert, cooperative, no distress, appears stated age  Head:    Normocephalic, without obvious abnormality, atraumatic  Eyes:    PERRL, conjunctiva/corneas clear, EOM's intact, fundi    benign, both eyes  Ears:    Normal TM's and external ear canals, both ears  Nose:   Nares normal, septum midline, mucosa normal, no drainage    or sinus tenderness  Throat:   Lips, mucosa, and tongue normal; teeth and gums normal  Neck:   Supple, symmetrical, trachea midline, no adenopathy;    thyroid:  no enlargement/tenderness/nodules; no carotid   bruit or JVD  Back:     Symmetric, no curvature, ROM normal, no CVA tenderness  Lungs:     Clear to auscultation bilaterally, respirations unlabored  Chest Wall:    No tenderness or deformity   Heart:     Regular rate and rhythm, S1 and S2 normal, no murmur, rub   or gallop  Breast Exam:    No tenderness, masses, or nipple abnormality  Abdomen:     Soft, non-tender, bowel sounds active all four quadrants,    no masses, no organomegaly  Genitalia:    Normal female without lesion, discharge or tenderness  Rectal:    Normal tone, normal prostate, no masses or tenderness;   guaiac negative stool  Extremities:   Extremities normal, atraumatic, no cyanosis or edema  Pulses:   2+ and symmetric all extremities  Skin:   Skin color, texture, turgor normal, no rashes or lesions  Lymph nodes:   Cervical, supraclavicular, and axillary nodes normal  Neurologic:   CNII-XII intact, normal strength, sensation and reflexes    throughout    Predictors of intubation difficulty:  Morbid obesity? no  Anatomically abnormal facies? no  Prominent incisors? no  Receding mandible? no  Short, thick neck? no  Neck range of motion: normal  Dentition: No chipped, loose, or missing teeth.  Cardiographics ECG: normal sinus rhythm, no blocks or conduction defects, no ischemic changes Echocardiogram: not done  Imaging Chest x-ray:  NA    Lab Review  No visits with results within 6 Month(s) from this visit.  Latest known visit with results is:  Scanned Document on 01/01/2023  Component Date Value   HM Mammogram 10/21/2022 0-4 Bi-Rad    HM Pap  smear 10/22/2022 NILM    A1c 10/22/2022 5.1       Assessment:      52 y.o. female with planned surgery as above.   Known risk factors for perioperative complications: None   Difficulty with intubation is not anticipated.  Cardiac Risk Estimation: low  Current medications which may produce withdrawal symptoms if withheld perioperatively: none    Plan:    1. Preoperative workup as follows none. 2. Change in medication regimen before surgery: discontinue NSAIDs (Celebrex) 14 days before surgery. 3. Prophylaxis for cardiac events with perioperative beta-blockers:  not indicated. 4. Invasive hemodynamic monitoring perioperatively: at the discretion of anesthesiologist. 5. Deep vein thrombosis prophylaxis postoperatively:regimen to be chosen by surgical team. 6. Surveillance for postoperative MI with ECG immediately postoperatively and on postoperative days 1 and 2 AND troponin levels 24 hours postoperatively and on day 4 or hospital discharge (whichever comes first): at the discretion of anesthesiologist. 7. Other measures:  none

## 2023-07-14 NOTE — Telephone Encounter (Signed)
Forms have been completed and placed in scan

## 2023-07-14 NOTE — Telephone Encounter (Signed)
Forms were completed and given to [facility] to fax

## 2023-07-19 DIAGNOSIS — N959 Unspecified menopausal and perimenopausal disorder: Secondary | ICD-10-CM | POA: Diagnosis not present

## 2023-08-18 NOTE — Progress Notes (Signed)
Sent message, via epic in basket, requesting orders in epic from surgeon.  

## 2023-08-19 ENCOUNTER — Ambulatory Visit: Payer: Self-pay | Admitting: Student

## 2023-08-20 ENCOUNTER — Other Ambulatory Visit (HOSPITAL_BASED_OUTPATIENT_CLINIC_OR_DEPARTMENT_OTHER): Payer: Self-pay

## 2023-08-20 ENCOUNTER — Other Ambulatory Visit: Payer: Self-pay

## 2023-08-20 MED ORDER — PROGESTERONE MICRONIZED 100 MG PO CAPS
100.0000 mg | ORAL_CAPSULE | Freq: Every day | ORAL | 3 refills | Status: AC
  Filled 2023-08-20 – 2023-09-01 (×2): qty 90, 90d supply, fill #0
  Filled 2023-12-22: qty 90, 90d supply, fill #1
  Filled ????-??-??: fill #0

## 2023-08-23 ENCOUNTER — Other Ambulatory Visit (HOSPITAL_BASED_OUTPATIENT_CLINIC_OR_DEPARTMENT_OTHER): Payer: Self-pay

## 2023-08-23 NOTE — Progress Notes (Signed)
COVID Vaccine received:  []  No [x]  Yes Date of any COVID positive Test in last 90 days:  PCP - Neena Rhymes, MD Medical clearance on chart from 07-07-23 office visit Cardiologist -   Chest x-ray -  EKG - 07-07-2023  on chart from PCP   Stress Test -  ECHO -  Cardiac Cath -   PCR screen: [x]  Ordered & Completed []   No Order but Needs PROFEND     []   N/A for this surgery  Surgery Plan:  [x]  Ambulatory   []  Outpatient in bed  []  Admit Anesthesia:    []  General  [x]  Spinal  []   Choice []   MAC  Pacemaker / ICD device [x]  No []  Yes   Spinal Cord Stimulator:[x]  No []  Yes       History of Sleep Apnea? [x]  No []  Yes   CPAP used?- [x]  No []  Yes    Does the patient monitor blood sugar?   [x]  N/A   []  No []  Yes  Patient has: [x]  NO Hx DM   []  Pre-DM   []  DM1  []   DM2 Last A1c was: 5.1  on  10-22-2022         GLP1 agonist / usual dose - tirzepatide (Mounjaro)  GLP1 instructions: Hold x 7 days prior.  Last day will be:   Blood Thinner / Instructions: none Aspirin Instructions:  none  ERAS Protocol Ordered: []  No  [x]  Yes PRE-SURGERY [x]  ENSURE  []  G2   Patient is to be NPO after: 04:30  Dental hx: []  Dentures:  []  N/A      []  Bridge or Partial:                   []  Loose or Damaged teeth:   Comments: Patient was given the 5 CHG shower / bath instructions for TKA surgery along with 2 bottles of the CHG soap. Patient will start this on:  Sunday  08-29-2023    All questions were asked and answered, Patient voiced understanding of this process.   Activity level: Patient is able / unable to climb a flight of stairs without difficulty; []  No CP  []  No SOB, but would have ___   Patient can / can not perform ADLs without assistance.   Anesthesia review: PONV,  Nystagmus  Patient denies shortness of breath, fever, cough and chest pain at PAT appointment.  Patient verbalized understanding and agreement to the Pre-Surgical Instructions that were given to them at this PAT appointment. Patient  was also educated of the need to review these PAT instructions again prior to her surgery.I reviewed the appropriate phone numbers to call if they have any and questions or concerns.

## 2023-08-23 NOTE — Patient Instructions (Addendum)
SURGICAL WAITING ROOM VISITATION Patients having surgery or a procedure may have no more than 2 support people in the waiting area - these visitors may rotate in the visitor waiting room.   Due to an increase in RSV and influenza rates and associated hospitalizations, children ages 39 and under may not visit patients in [facility] hospitals. If the patient needs to stay at the hospital during part of their recovery, the visitor guidelines for inpatient rooms apply.  PRE-OP VISITATION  Pre-op nurse will coordinate an appropriate time for 1 support person to accompany the patient in pre-op.  This support person may not rotate.  This visitor will be contacted when the time is appropriate for the visitor to come back in the pre-op area.  Please refer to the [facility] website for the visitor guidelines for Inpatients (after your surgery is over and you are in a regular room).  You are not required to quarantine at this time prior to your surgery. However, you must do this: Hand Hygiene often Do NOT share personal items Notify your provider if you are in close contact with someone who has COVID or you develop fever 100.4 or greater, new onset of sneezing, cough, sore throat, shortness of breath or body aches.  If you test positive for Covid or have been in contact with anyone that has tested positive in the last 10 days please notify you surgeon.    Your procedure is scheduled on:  Thursday  September 02, 2023  Report to [facility] Main Entrance: [place] entrance where the [employer] is available.   Report to admitting at:  05:15   AM  Call this number if you have any questions or problems the morning of surgery [phone]  Do not eat food after Midnight the night prior to your surgery/procedure.  After Midnight you may have the following liquids until   04:30   AM  DAY OF SURGERY  Clear Liquid Diet Water Black Coffee (sugar ok, NO MILK/CREAM OR CREAMERS)  Tea (sugar ok,  NO MILK/CREAM OR CREAMERS) regular and decaf                             Plain Jell-O  with no fruit (NO RED)                                           Fruit ices (not with fruit pulp, NO RED)                                     Popsicles (NO RED)                                                                  Juice: NO CITRUS JUICES: only apple, WHITE grape, WHITE cranberry Sports drinks like Gatorade or Powerade (NO RED)                   The day of surgery:  Drink ONE (1) Pre-Surgery Clear Ensure at  04:30 AM  the morning of surgery. Drink in one sitting. Do not sip.  This drink was given to you during your hospital pre-op appointment visit. Nothing else to drink after completing the Pre-Surgery Clear Ensure : No candy, chewing gum or throat lozenges.    FOLLOW ANY ADDITIONAL PRE OP INSTRUCTIONS YOU RECEIVED FROM YOUR SURGEON'S OFFICE!!!   Oral Hygiene is also important to reduce your risk of infection.        Remember - BRUSH YOUR TEETH THE MORNING OF SURGERY WITH YOUR REGULAR TOOTHPASTE  Do NOT smoke after Midnight the night before surgery.  STOP TAKING all Vitamins, Herbs and supplements 1 week before your surgery.   Tirzepatide (Mounjaro)   Hold x 7 days prior.  Last injection will be on : 08-24-2023  Take ONLY these medicines the morning of surgery with A SIP OF WATER: none                   You may not have any metal on your body including hair pins, jewelry, and body piercing  Do not wear make-up, lotions, powders, perfumes  or deodorant  Do not wear nail polish including gel and S&S, artificial / acrylic nails, or any other type of covering on natural nails including finger and toenails. If you have artificial nails, gel coating, etc., that needs to be removed by a nail salon, Please have this removed prior to surgery. Not doing so may mean that your surgery could be cancelled or delayed if the Surgeon or anesthesia staff feels like they are unable to monitor you safely.    Do not shave 48 hours prior to surgery to avoid nicks in your skin which may contribute to postoperative infections.   Patients discharged on the day of surgery will not be allowed to drive home.  Someone NEEDS to stay with you for the first 24 hours after anesthesia.  Do not bring your home medications to the hospital. The Pharmacy will dispense medications listed on your medication list to you during your admission in the Hospital.  Please read over the following fact sheets you were given: IF YOU HAVE QUESTIONS ABOUT YOUR PRE-OP INSTRUCTIONS, PLEASE CALL [phone].     Pre-operative 5 CHG Bath Instructions   You can play a key role in reducing the risk of infection after surgery. Your skin needs to be as free of germs as possible. You can reduce the number of germs on your skin by washing with CHG (chlorhexidine gluconate) soap before surgery. CHG is an antiseptic soap that kills germs and continues to kill germs even after washing.   DO NOT use if you have an allergy to chlorhexidine/CHG or antibacterial soaps. If your skin becomes reddened or irritated, stop using the CHG and notify one of our RNs at [phone]  Please shower with the CHG soap starting 4 days before surgery using the following schedule: START SHOWERS ON    SUNDAY  August 29, 2023  Please keep in mind the following:  DO NOT shave, including legs and underarms, starting the day of your first shower.   You may shave your face at any point before/day of surgery.   Place clean sheets on your bed the day you start using CHG soap. Use a clean washcloth (not used since being washed) for each shower. DO NOT sleep with pets once you start using the CHG.   CHG Shower Instructions:  If you choose to wash your hair and private area, wash  first with your normal shampoo/soap.  After you use shampoo/soap, rinse your hair and body thoroughly to remove shampoo/soap residue.  Turn the water OFF and apply about 3 tablespoons (45 ml) of CHG soap to a CLEAN washcloth.  Apply CHG soap ONLY FROM YOUR NECK DOWN TO YOUR TOES (washing for 3-5 minutes)  DO NOT use CHG soap on face, private areas, open wounds, or sores.  Pay special attention to the area where your surgery is being performed.  If you are having back surgery, having someone wash your back for you may be helpful.  Wait 2 minutes after CHG soap is applied, then you may rinse off the CHG soap.  Pat dry with a clean towel  Put on clean clothes/pajamas   If you choose to wear lotion, please use ONLY the CHG-compatible lotions on the back of this paper.     Additional instructions for the day of surgery: DO NOT APPLY any lotions, deodorants, cologne, or perfumes.   Put on clean/comfortable clothes.  Brush your teeth.  Ask your nurse before applying any prescription medications to the skin.      CHG Compatible Lotions   Aveeno Moisturizing lotion  Cetaphil Moisturizing Cream  Cetaphil Moisturizing Lotion  Clairol Herbal Essence Moisturizing Lotion, Dry Skin  Clairol Herbal Essence Moisturizing Lotion, Extra Dry Skin  Clairol Herbal Essence Moisturizing Lotion, Normal Skin  Curel Age Defying Therapeutic Moisturizing Lotion with Alpha Hydroxy  Curel Extreme Care Body Lotion  Curel Soothing Hands Moisturizing Hand Lotion  Curel Therapeutic Moisturizing Cream, Fragrance-Free  Curel Therapeutic Moisturizing Lotion, Fragrance-Free  Curel Therapeutic Moisturizing Lotion, Original Formula  Eucerin Daily Replenishing Lotion  Eucerin Dry Skin Therapy Plus Alpha Hydroxy Crme  Eucerin Dry Skin Therapy Plus Alpha Hydroxy Lotion  Eucerin Original Crme  Eucerin Original Lotion  Eucerin Plus Crme Eucerin Plus Lotion  Eucerin TriLipid Replenishing Lotion  Keri Anti-Bacterial  Hand Lotion  Keri Deep Conditioning Original Lotion Dry Skin Formula Softly Scented  Keri Deep Conditioning Original Lotion, Fragrance Free Sensitive Skin Formula  Keri Lotion Fast Absorbing Fragrance Free Sensitive Skin Formula  Keri Lotion Fast Absorbing Softly Scented Dry Skin Formula  Keri Original Lotion  Keri Skin Renewal Lotion Keri Silky Smooth Lotion  Keri Silky Smooth Sensitive Skin Lotion  Nivea Body Creamy Conditioning Oil  Nivea Body Extra Enriched Lotion  Nivea Body Original Lotion  Nivea Body Sheer Moisturizing Lotion Nivea Crme  Nivea Skin Firming Lotion  NutraDerm 30 Skin Lotion  NutraDerm Skin Lotion  NutraDerm Therapeutic Skin Cream  NutraDerm Therapeutic Skin Lotion  ProShield Protective Hand Cream  Provon moisturizing lotion   FAILURE TO FOLLOW THESE INSTRUCTIONS MAY RESULT IN THE CANCELLATION OF YOUR SURGERY  PATIENT SIGNATURE_________________________________  NURSE SIGNATURE__________________________________  ________________________________________________________________________       Rogelia Mire    An incentive spirometer is a tool that can help keep your lungs clear and active. This tool measures how well you are filling your lungs with each breath. Taking  long deep breaths may help reverse or decrease the chance of developing breathing (pulmonary) problems (especially infection) following: A long period of time when you are unable to move or be active. BEFORE THE PROCEDURE  If the spirometer includes an indicator to show your best effort, your nurse or respiratory therapist will set it to a desired goal. If possible, sit up straight or lean slightly forward. Try not to slouch. Hold the incentive spirometer in an upright position. INSTRUCTIONS FOR USE  Sit on the edge of your bed if possible, or sit up as far as you can in bed or on a chair. Hold the incentive spirometer in an upright position. Breathe out normally. Place the  mouthpiece in your mouth and seal your lips tightly around it. Breathe in slowly and as deeply as possible, raising the piston or the ball toward the top of the column. Hold your breath for 3-5 seconds or for as long as possible. Allow the piston or ball to fall to the bottom of the column. Remove the mouthpiece from your mouth and breathe out normally. Rest for a few seconds and repeat Steps 1 through 7 at least 10 times every 1-2 hours when you are awake. Take your time and take a few normal breaths between deep breaths. The spirometer may include an indicator to show your best effort. Use the indicator as a goal to work toward during each repetition. After each set of 10 deep breaths, practice coughing to be sure your lungs are clear. If you have an incision (the cut made at the time of surgery), support your incision when coughing by placing a pillow or rolled up towels firmly against it. Once you are able to get out of bed, walk around indoors and cough well. You may stop using the incentive spirometer when instructed by your caregiver.  RISKS AND COMPLICATIONS Take your time so you do not get dizzy or light-headed. If you are in pain, you may need to take or ask for pain medication before doing incentive spirometry. It is harder to take a deep breath if you are having pain. AFTER USE Rest and breathe slowly and easily. It can be helpful to keep track of a log of your progress. Your caregiver can provide you with a simple table to help with this. If you are using the spirometer at home, follow these instructions: SEEK MEDICAL CARE IF:  You are having difficultly using the spirometer. You have trouble using the spirometer as often as instructed. Your pain medication is not giving enough relief while using the spirometer. You develop fever of 100.5 F (38.1 C) or higher.                                                                                                    SEEK IMMEDIATE MEDICAL  CARE IF:  You cough up bloody sputum that had not been present before. You develop fever of 102 F (38.9 C) or greater. You develop worsening pain at or near the incision site. MAKE SURE YOU:  Understand these instructions. Will watch your condition.  Will get help right away if you are not doing well or get worse. Document Released: 03/01/2007 Document Revised: 01/11/2012 Document Reviewed: 05/02/2007 [facility] Patient Information 2014 [place], [place].

## 2023-08-25 ENCOUNTER — Encounter (HOSPITAL_COMMUNITY)
Admission: RE | Admit: 2023-08-25 | Discharge: 2023-08-25 | Disposition: A | Discharge status: Home / Self Care | Payer: 59 | Source: Ambulatory Visit | Attending: Orthopedic Surgery

## 2023-08-25 ENCOUNTER — Encounter (HOSPITAL_COMMUNITY): Payer: Self-pay

## 2023-08-25 ENCOUNTER — Other Ambulatory Visit: Payer: Self-pay

## 2023-08-25 VITALS — BP 122/75 | HR 72 | Temp 97.8°F | Resp 18 | Ht 64.5 in | Wt 204.0 lb

## 2023-08-25 DIAGNOSIS — Z01812 Encounter for preprocedural laboratory examination: Secondary | ICD-10-CM | POA: Insufficient documentation

## 2023-08-25 DIAGNOSIS — Z01818 Encounter for other preprocedural examination: Secondary | ICD-10-CM

## 2023-08-25 HISTORY — DX: Benign paroxysmal vertigo, unspecified ear: H81.10

## 2023-08-25 HISTORY — DX: Family history of other specified conditions: Z84.89

## 2023-08-25 LAB — CBC
HCT: 41.2 % (ref 36.0–46.0)
Hemoglobin: 13.9 g/dL (ref 12.0–15.0)
MCH: 31 pg (ref 26.0–34.0)
MCHC: 33.7 g/dL (ref 30.0–36.0)
MCV: 91.8 fL (ref 80.0–100.0)
Platelets: 367 10*3/uL (ref 150–400)
RBC: 4.49 MIL/uL (ref 3.87–5.11)
RDW: 12.1 % (ref 11.5–15.5)
WBC: 5.3 10*3/uL (ref 4.0–10.5)
nRBC: 0 % (ref 0.0–0.2)

## 2023-08-25 LAB — BASIC METABOLIC PANEL
Anion gap: 7 (ref 5–15)
BUN: 13 mg/dL (ref 6–20)
CO2: 25 mmol/L (ref 22–32)
Calcium: 9.1 mg/dL (ref 8.9–10.3)
Chloride: 105 mmol/L (ref 98–111)
Creatinine, Ser: 0.78 mg/dL (ref 0.44–1.00)
GFR, Estimated: >60 mL/min (ref >60)
Glucose, Bld: 82 mg/dL (ref 70–99)
Potassium: 4.4 mmol/L (ref 3.5–5.1)
Sodium: 137 mmol/L (ref 135–145)

## 2023-08-25 LAB — SURGICAL PCR SCREEN
MRSA, PCR: NEGATIVE
Staphylococcus aureus: NEGATIVE

## 2023-08-31 ENCOUNTER — Ambulatory Visit: Payer: Self-pay | Admitting: Student

## 2023-08-31 ENCOUNTER — Other Ambulatory Visit (HOSPITAL_BASED_OUTPATIENT_CLINIC_OR_DEPARTMENT_OTHER): Payer: Self-pay

## 2023-08-31 MED ORDER — ASPIRIN 81 MG PO CHEW
81.0000 mg | CHEWABLE_TABLET | Freq: Two times a day (BID) | ORAL | 0 refills | Status: DC
  Filled 2023-09-01: qty 90, 45d supply, fill #0

## 2023-08-31 MED ORDER — ONDANSETRON HCL 4 MG PO TABS
4.0000 mg | ORAL_TABLET | Freq: Four times a day (QID) | ORAL | 0 refills | Status: DC | PRN
  Filled 2023-09-01: qty 20, 5d supply, fill #0

## 2023-08-31 MED ORDER — SENNOSIDES 8.6 MG PO TABS
2.0000 | ORAL_TABLET | Freq: Every day | ORAL | 0 refills | Status: DC
  Filled 2023-09-01: qty 60, 30d supply, fill #0

## 2023-08-31 MED ORDER — DOCUSATE SODIUM 100 MG PO CAPS
100.0000 mg | ORAL_CAPSULE | Freq: Two times a day (BID) | ORAL | 0 refills | Status: DC
  Filled 2023-09-01: qty 60, 30d supply, fill #0

## 2023-08-31 MED ORDER — HYDROMORPHONE HCL 2 MG PO TABS
2.0000 mg | ORAL_TABLET | ORAL | 0 refills | Status: DC
  Filled 2023-09-01: qty 42, 7d supply, fill #0

## 2023-08-31 MED ORDER — METHOCARBAMOL 500 MG PO TABS
500.0000 mg | ORAL_TABLET | Freq: Four times a day (QID) | ORAL | 0 refills | Status: DC | PRN
  Filled 2023-09-01: qty 30, 8d supply, fill #0

## 2023-08-31 NOTE — H&P (View-Only) (Signed)
TOTAL KNEE ADMISSION H&P  Patient is being admitted for left total knee arthroplasty.  Subjective:  Chief Complaint:left knee pain.  HPI: Stacie Webster, 52 y.o. female, has a history of pain and functional disability in the left knee due to arthritis and has failed non-surgical conservative treatments for greater than 12 weeks to includeNSAID's and/or analgesics, corticosteriod injections, viscosupplementation injections, flexibility and strengthening excercises, supervised PT with diminished ADL's post treatment, use of assistive devices, and activity modification.  Onset of symptoms was gradual, starting 10 years ago with rapidlly worsening course since that time. The patient noted prior procedures on the knee to include  arthroscopy on the left knee(s).  Patient currently rates pain in the left knee(s) at 10 out of 10 with activity. Patient has night pain, worsening of pain with activity and weight bearing, pain that interferes with activities of daily living, pain with passive range of motion, crepitus, and joint swelling.  Patient has evidence of subchondral cysts, subchondral sclerosis, periarticular osteophytes, and joint space narrowing by imaging studies. There is no active infection.  Patient Active Problem List   Diagnosis Date Noted   S/P knee replacement 01/13/2019   Physical exam 11/30/2018   Obesity (BMI 30-39.9) 11/07/2018   Family history of colon cancer paternal grandmother and great grandmother and father w/ polyps 03/09/2018   Allergic rhinitis 12/19/2012   ANISOCORIA 03/26/2009   Nystagmus 03/26/2009   Backache 01/23/2009   Past Medical History:  Diagnosis Date   Allergy    Arthritis    knees   Benign paroxysmal vertigo    some nystagmus   Complication of anesthesia    Family history of adverse reaction to anesthesia    PONV   PONV (postoperative nausea and vomiting)     Past Surgical History:  Procedure Laterality Date   CARPAL TUNNEL RELEASE Right     CHOLECYSTECTOMY     KNEE SURGERY Bilateral    TONSILLECTOMY     TOTAL KNEE ARTHROPLASTY Right 01/13/2019   Procedure: TOTAL KNEE ARTHROPLASTY;  Surgeon: Eugenia Mcalpine, MD;  Location: [facility];  Service: Orthopedics;  Laterality: Right;  120 minutes    Current Outpatient Medications  Medication Sig Dispense Refill Last Dose   celecoxib (CELEBREX) 200 MG capsule Take 1 capsule (200 mg total) by mouth daily with meals for pain. 30 capsule 3    progesterone (PROMETRIUM) 100 MG capsule Take 1 capsule (100 mg total) by mouth daily. 90 capsule 3    TESTOSTERONE COMPOUNDING KIT TD Place 1 Application onto the skin 2 (two) times a week. Testosterone 40 mg per ml, 4% cream. Apply pea sized amount to inner thigh twice weekly      TIRZEPATIDE Cope Inject 3.825 mg into the skin once a week.      No current facility-administered medications for this visit.   Allergies  Allergen Reactions   Penicillins Hives and Shortness Of Breath        Percocet [Oxycodone-Acetaminophen] Nausea And Vomiting   Ciprofloxacin Other (See Comments)    Severe muscle weakness   Meloxicam Other (See Comments)    Mouth ulcers   Quinolones Other (See Comments)    Severe muscle weakness    Social History   Tobacco Use   Smoking status: Never   Smokeless tobacco: Never  Substance Use Topics   Alcohol use: No    Family History  Problem Relation Age of Onset   Colon polyps Father    Colon cancer Paternal Grandmother    Colon cancer  Other        paternal great grandmother   Esophageal cancer Neg Hx    Rectal cancer Neg Hx    Stomach cancer Neg Hx      Review of Systems  Musculoskeletal:  Positive for arthralgias, gait problem and joint swelling.  All other systems reviewed and are negative.   Objective:  Physical Exam Constitutional:      Appearance: Normal appearance.  HENT:     Head: Normocephalic and atraumatic.     Nose: Nose normal.     Mouth/Throat:     Mouth: Mucous membranes are moist.      Pharynx: Oropharynx is clear.  Eyes:     Conjunctiva/sclera: Conjunctivae normal.  Cardiovascular:     Rate and Rhythm: Normal rate and regular rhythm.     Pulses: Normal pulses.     Heart sounds: Normal heart sounds.  Pulmonary:     Effort: Pulmonary effort is normal.     Breath sounds: Normal breath sounds.  Abdominal:     General: Abdomen is flat.     Palpations: Abdomen is soft.  Genitourinary:    Comments: deferred Musculoskeletal:     Cervical back: Normal range of motion and neck supple.     Comments: Examination of the left knee reveals healed arthroscopy portals. No skin wounds or lesions. Mild swelling, no effusion. Pain with palpation over the lateral joint line, periretinacular tissues, with positive grind sign. Crepitus noted. Range of motion 0 to 120 degrees. No ligamentous instability noted. Painless hip range of motion.   Neurovascularly intact distally.   Skin:    General: Skin is warm and dry.     Capillary Refill: Capillary refill takes less than 2 seconds.  Neurological:     General: No focal deficit present.     Mental Status: She is alert and oriented to person, place, and time.  Psychiatric:        Mood and Affect: Mood normal.        Behavior: Behavior normal.        Thought Content: Thought content normal.        Judgment: Judgment normal.     Vital signs in last 24 hours: @VSRANGES @  Labs:   Estimated body mass index is 34.48 kg/m as calculated from the following:   Height as of 08/25/23: 5' 4.5" (1.638 m).   Weight as of 08/25/23: 92.5 kg.   Imaging Review Plain radiographs demonstrate severe degenerative joint disease of the left knee(s). The overall alignment issignificant valgus. The bone quality appears to be adequate for age and reported activity level.      Assessment/Plan:  End stage arthritis, left knee   The patient history, physical examination, clinical judgment of the provider and imaging studies are consistent with end  stage degenerative joint disease of the left knee(s) and total knee arthroplasty is deemed medically necessary. The treatment options including medical management, injection therapy arthroscopy and arthroplasty were discussed at length. The risks and benefits of total knee arthroplasty were presented and reviewed. The risks due to aseptic loosening, infection, stiffness, patella tracking problems, thromboembolic complications and other imponderables were discussed. The patient acknowledged the explanation, agreed to proceed with the plan and consent was signed. Patient is being admitted for inpatient treatment for surgery, pain control, PT, OT, prophylactic antibiotics, VTE prophylaxis, progressive ambulation and ADL's and discharge planning. The patient is planning to be discharged home with OPPT.   Therapy Plans: outpatient therapy. [facility] 09/06/23  1st appt. Works there.  Disposition: Home with husband Planned DVT Prophylaxis: aspirin 81mg  BID DME needed: Has rolling walker. Has ice machine.  PCP: Cleared.  TXA: IV Allergies:  - Ciprofloxacin - myalgias - Meloxicam - mouth sores - Penicillins - edema - Quniolones - myalgias Anesthesia Concerns: Has nausea with anesthesia.  BMI: 34.2 Last HgbA1c: Not diabetic.  Other: - ZOXWRU hold 7 days prior to surgery.  - Dilaudid 1st rx then try and switch to hydrocodone, zofran, methocarbamol, has celebrex.  - Meds sent to pharmacy 09/01/23.  - 08/25/23: Hgb 13.9, K+ 4.4, Cr. 0.78.     Patient's anticipated LOS is less than 2 midnights, meeting these requirements: - Younger than 19 - Lives within 1 hour of care - Has a competent adult at home to recover with post-op recover - NO history of  - Chronic pain requiring opiods  - Diabetes  - Coronary Artery Disease  - Heart failure  - Heart attack  - Stroke  - DVT/VTE  - Cardiac arrhythmia  - Respiratory Failure/COPD  - Renal failure  - Anemia  - Advanced Liver disease

## 2023-08-31 NOTE — H&P (Signed)
TOTAL KNEE ADMISSION H&P  Patient is being admitted for left total knee arthroplasty.  Subjective:  Chief Complaint:left knee pain.  HPI: Stacie Webster, 52 y.o. female, has a history of pain and functional disability in the left knee due to arthritis and has failed non-surgical conservative treatments for greater than 12 weeks to includeNSAID's and/or analgesics, corticosteriod injections, viscosupplementation injections, flexibility and strengthening excercises, supervised PT with diminished ADL's post treatment, use of assistive devices, and activity modification.  Onset of symptoms was gradual, starting 10 years ago with rapidlly worsening course since that time. The patient noted prior procedures on the knee to include  arthroscopy on the left knee(s).  Patient currently rates pain in the left knee(s) at 10 out of 10 with activity. Patient has night pain, worsening of pain with activity and weight bearing, pain that interferes with activities of daily living, pain with passive range of motion, crepitus, and joint swelling.  Patient has evidence of subchondral cysts, subchondral sclerosis, periarticular osteophytes, and joint space narrowing by imaging studies. There is no active infection.  Patient Active Problem List   Diagnosis Date Noted   S/P knee replacement 01/13/2019   Physical exam 11/30/2018   Obesity (BMI 30-39.9) 11/07/2018   Family history of colon cancer paternal grandmother and great grandmother and father w/ polyps 03/09/2018   Allergic rhinitis 12/19/2012   ANISOCORIA 03/26/2009   Nystagmus 03/26/2009   Backache 01/23/2009   Past Medical History:  Diagnosis Date   Allergy    Arthritis    knees   Benign paroxysmal vertigo    some nystagmus   Complication of anesthesia    Family history of adverse reaction to anesthesia    PONV   PONV (postoperative nausea and vomiting)     Past Surgical History:  Procedure Laterality Date   CARPAL TUNNEL RELEASE Right     CHOLECYSTECTOMY     KNEE SURGERY Bilateral    TONSILLECTOMY     TOTAL KNEE ARTHROPLASTY Right 01/13/2019   Procedure: TOTAL KNEE ARTHROPLASTY;  Surgeon: Eugenia Mcalpine, MD;  Location: [facility];  Service: Orthopedics;  Laterality: Right;  120 minutes    Current Outpatient Medications  Medication Sig Dispense Refill Last Dose   celecoxib (CELEBREX) 200 MG capsule Take 1 capsule (200 mg total) by mouth daily with meals for pain. 30 capsule 3    progesterone (PROMETRIUM) 100 MG capsule Take 1 capsule (100 mg total) by mouth daily. 90 capsule 3    TESTOSTERONE COMPOUNDING KIT TD Place 1 Application onto the skin 2 (two) times a week. Testosterone 40 mg per ml, 4% cream. Apply pea sized amount to inner thigh twice weekly      TIRZEPATIDE Cope Inject 3.825 mg into the skin once a week.      No current facility-administered medications for this visit.   Allergies  Allergen Reactions   Penicillins Hives and Shortness Of Breath        Percocet [Oxycodone-Acetaminophen] Nausea And Vomiting   Ciprofloxacin Other (See Comments)    Severe muscle weakness   Meloxicam Other (See Comments)    Mouth ulcers   Quinolones Other (See Comments)    Severe muscle weakness    Social History   Tobacco Use   Smoking status: Never   Smokeless tobacco: Never  Substance Use Topics   Alcohol use: No    Family History  Problem Relation Age of Onset   Colon polyps Father    Colon cancer Paternal Grandmother    Colon cancer  Other        paternal great grandmother   Esophageal cancer Neg Hx    Rectal cancer Neg Hx    Stomach cancer Neg Hx      Review of Systems  Musculoskeletal:  Positive for arthralgias, gait problem and joint swelling.  All other systems reviewed and are negative.   Objective:  Physical Exam Constitutional:      Appearance: Normal appearance.  HENT:     Head: Normocephalic and atraumatic.     Nose: Nose normal.     Mouth/Throat:     Mouth: Mucous membranes are moist.      Pharynx: Oropharynx is clear.  Eyes:     Conjunctiva/sclera: Conjunctivae normal.  Cardiovascular:     Rate and Rhythm: Normal rate and regular rhythm.     Pulses: Normal pulses.     Heart sounds: Normal heart sounds.  Pulmonary:     Effort: Pulmonary effort is normal.     Breath sounds: Normal breath sounds.  Abdominal:     General: Abdomen is flat.     Palpations: Abdomen is soft.  Genitourinary:    Comments: deferred Musculoskeletal:     Cervical back: Normal range of motion and neck supple.     Comments: Examination of the left knee reveals healed arthroscopy portals. No skin wounds or lesions. Mild swelling, no effusion. Pain with palpation over the lateral joint line, periretinacular tissues, with positive grind sign. Crepitus noted. Range of motion 0 to 120 degrees. No ligamentous instability noted. Painless hip range of motion.   Neurovascularly intact distally.   Skin:    General: Skin is warm and dry.     Capillary Refill: Capillary refill takes less than 2 seconds.  Neurological:     General: No focal deficit present.     Mental Status: She is alert and oriented to person, place, and time.  Psychiatric:        Mood and Affect: Mood normal.        Behavior: Behavior normal.        Thought Content: Thought content normal.        Judgment: Judgment normal.     Vital signs in last 24 hours: @VSRANGES @  Labs:   Estimated body mass index is 34.48 kg/m as calculated from the following:   Height as of 08/25/23: 5' 4.5" (1.638 m).   Weight as of 08/25/23: 92.5 kg.   Imaging Review Plain radiographs demonstrate severe degenerative joint disease of the left knee(s). The overall alignment issignificant valgus. The bone quality appears to be adequate for age and reported activity level.      Assessment/Plan:  End stage arthritis, left knee   The patient history, physical examination, clinical judgment of the provider and imaging studies are consistent with end  stage degenerative joint disease of the left knee(s) and total knee arthroplasty is deemed medically necessary. The treatment options including medical management, injection therapy arthroscopy and arthroplasty were discussed at length. The risks and benefits of total knee arthroplasty were presented and reviewed. The risks due to aseptic loosening, infection, stiffness, patella tracking problems, thromboembolic complications and other imponderables were discussed. The patient acknowledged the explanation, agreed to proceed with the plan and consent was signed. Patient is being admitted for inpatient treatment for surgery, pain control, PT, OT, prophylactic antibiotics, VTE prophylaxis, progressive ambulation and ADL's and discharge planning. The patient is planning to be discharged home with OPPT.   Therapy Plans: outpatient therapy. [facility] 09/06/23  1st appt. Works there.  Disposition: Home with husband Planned DVT Prophylaxis: aspirin 81mg  BID DME needed: Has rolling walker. Has ice machine.  PCP: Cleared.  TXA: IV Allergies:  - Ciprofloxacin - myalgias - Meloxicam - mouth sores - Penicillins - edema - Quniolones - myalgias Anesthesia Concerns: Has nausea with anesthesia.  BMI: 34.2 Last HgbA1c: Not diabetic.  Other: - ZOXWRU hold 7 days prior to surgery.  - Dilaudid 1st rx then try and switch to hydrocodone, zofran, methocarbamol, has celebrex.  - Meds sent to pharmacy 09/01/23.  - 08/25/23: Hgb 13.9, K+ 4.4, Cr. 0.78.     Patient's anticipated LOS is less than 2 midnights, meeting these requirements: - Younger than 19 - Lives within 1 hour of care - Has a competent adult at home to recover with post-op recover - NO history of  - Chronic pain requiring opiods  - Diabetes  - Coronary Artery Disease  - Heart failure  - Heart attack  - Stroke  - DVT/VTE  - Cardiac arrhythmia  - Respiratory Failure/COPD  - Renal failure  - Anemia  - Advanced Liver disease

## 2023-09-01 ENCOUNTER — Encounter (HOSPITAL_COMMUNITY): Payer: Self-pay | Admitting: Orthopedic Surgery

## 2023-09-01 ENCOUNTER — Other Ambulatory Visit: Payer: Self-pay

## 2023-09-01 ENCOUNTER — Other Ambulatory Visit (HOSPITAL_BASED_OUTPATIENT_CLINIC_OR_DEPARTMENT_OTHER): Payer: Self-pay

## 2023-09-01 NOTE — Anesthesia Preprocedure Evaluation (Addendum)
Anesthesia Evaluation  Patient identified by MRN, date of birth, ID band Patient awake    Reviewed: Allergy & Precautions, NPO status , Patient's Chart, lab work & pertinent test results  History of Anesthesia Complications (+) PONV and history of anesthetic complications  Airway Mallampati: II  TM Distance: >3 FB Neck ROM: Full    Dental  (+) Dental Advisory Given, Teeth Intact   Pulmonary neg pulmonary ROS   Pulmonary exam normal        Cardiovascular negative cardio ROS Normal cardiovascular exam     Neuro/Psych  BPPV   negative psych ROS   GI/Hepatic negative GI ROS, Neg liver ROS,,,  Endo/Other   Obesity   Renal/GU negative Renal ROS     Musculoskeletal  (+) Arthritis , Osteoarthritis,    Abdominal   Peds  Hematology negative hematology ROS (+)   Anesthesia Other Findings On GLP-1a, last dose >7 days ago   Reproductive/Obstetrics                             Anesthesia Physical Anesthesia Plan  ASA: 2  Anesthesia Plan: Spinal   Post-op Pain Management: Tylenol PO (pre-op)* and Regional block*   Induction:   PONV Risk Score and Plan: 3 and Treatment may vary due to age or medical condition, Propofol infusion and Scopolamine patch - Pre-op  Airway Management Planned: Natural Airway and Simple Face Mask  Additional Equipment: None  Intra-op Plan:   Post-operative Plan:   Informed Consent: I have reviewed the patients History and Physical, chart, labs and discussed the procedure including the risks, benefits and alternatives for the proposed anesthesia with the patient or authorized representative who has indicated his/her understanding and acceptance.       Plan Discussed with: CRNA and Anesthesiologist  Anesthesia Plan Comments:        Anesthesia Quick Evaluation

## 2023-09-02 ENCOUNTER — Ambulatory Visit (HOSPITAL_BASED_OUTPATIENT_CLINIC_OR_DEPARTMENT_OTHER): Payer: 59 | Admitting: Anesthesiology

## 2023-09-02 ENCOUNTER — Ambulatory Visit (HOSPITAL_COMMUNITY)
Admission: RE | Admit: 2023-09-02 | Discharge: 2023-09-02 | Disposition: A | Discharge status: Home / Self Care | Payer: 59 | Attending: Orthopedic Surgery | Admitting: Orthopedic Surgery

## 2023-09-02 ENCOUNTER — Encounter (HOSPITAL_COMMUNITY)
Admission: RE | Disposition: A | Discharge status: Home / Self Care | Payer: Self-pay | Source: Home / Self Care | Attending: Orthopedic Surgery

## 2023-09-02 ENCOUNTER — Ambulatory Visit (HOSPITAL_COMMUNITY): Payer: 59

## 2023-09-02 ENCOUNTER — Other Ambulatory Visit: Payer: Self-pay

## 2023-09-02 ENCOUNTER — Encounter (HOSPITAL_COMMUNITY): Payer: Self-pay | Admitting: Orthopedic Surgery

## 2023-09-02 ENCOUNTER — Ambulatory Visit (HOSPITAL_COMMUNITY): Payer: Self-pay | Admitting: Anesthesiology

## 2023-09-02 DIAGNOSIS — G8918 Other acute postprocedural pain: Secondary | ICD-10-CM | POA: Diagnosis not present

## 2023-09-02 DIAGNOSIS — M1712 Unilateral primary osteoarthritis, left knee: Secondary | ICD-10-CM

## 2023-09-02 DIAGNOSIS — Z7985 Long-term (current) use of injectable non-insulin antidiabetic drugs: Secondary | ICD-10-CM | POA: Diagnosis not present

## 2023-09-02 DIAGNOSIS — Z6834 Body mass index (BMI) 34.0-34.9, adult: Secondary | ICD-10-CM | POA: Insufficient documentation

## 2023-09-02 DIAGNOSIS — E669 Obesity, unspecified: Secondary | ICD-10-CM | POA: Insufficient documentation

## 2023-09-02 DIAGNOSIS — Z01818 Encounter for other preprocedural examination: Secondary | ICD-10-CM

## 2023-09-02 HISTORY — PX: KNEE ARTHROPLASTY: SHX992

## 2023-09-02 SURGERY — ARTHROPLASTY, KNEE, TOTAL, USING IMAGELESS COMPUTER-ASSISTED NAVIGATION
Anesthesia: Spinal | Site: Knee | Laterality: Left

## 2023-09-02 MED ORDER — HYDROMORPHONE HCL 1 MG/ML IJ SOLN
INTRAMUSCULAR | Status: AC
  Filled 2023-09-02: qty 1

## 2023-09-02 MED ORDER — BUPIVACAINE-EPINEPHRINE 0.25% -1:200000 IJ SOLN
INTRAMUSCULAR | Status: AC
Start: 2023-09-02 — End: ?
  Filled 2023-09-02: qty 1

## 2023-09-02 MED ORDER — BUPIVACAINE IN DEXTROSE 0.75-8.25 % IT SOLN
INTRATHECAL | Status: DC | PRN
  Administered 2023-09-02: 1.8 mL via INTRATHECAL

## 2023-09-02 MED ORDER — ONDANSETRON HCL 4 MG/2ML IJ SOLN
INTRAMUSCULAR | Status: DC | PRN
  Administered 2023-09-02: 4 mg via INTRAVENOUS

## 2023-09-02 MED ORDER — EPHEDRINE 5 MG/ML INJ
INTRAVENOUS | Status: AC
Start: 2023-09-02 — End: ?
  Filled 2023-09-02: qty 5

## 2023-09-02 MED ORDER — SCOPOLAMINE 1 MG/3DAYS TD PT72
1.0000 | MEDICATED_PATCH | TRANSDERMAL | Status: DC
  Administered 2023-09-02: 1.5 mg via TRANSDERMAL
  Filled 2023-09-02: qty 1

## 2023-09-02 MED ORDER — SODIUM CHLORIDE 0.9 % IR SOLN
Status: DC | PRN
  Administered 2023-09-02: 1000 mL

## 2023-09-02 MED ORDER — SODIUM CHLORIDE 0.9% IV SOLUTION
INTRAVENOUS | Status: DC | PRN
  Administered 2023-09-02: 250 mL

## 2023-09-02 MED ORDER — SODIUM CHLORIDE 0.9% FLUSH
INTRAVENOUS | Status: DC | PRN
  Administered 2023-09-02: 30 mL via INTRAVENOUS

## 2023-09-02 MED ORDER — FENTANYL CITRATE PF 50 MCG/ML IJ SOSY
PREFILLED_SYRINGE | INTRAMUSCULAR | Status: AC
  Filled 2023-09-02: qty 1

## 2023-09-02 MED ORDER — ACETAMINOPHEN 500 MG PO TABS
1000.0000 mg | ORAL_TABLET | Freq: Four times a day (QID) | ORAL | Status: DC
  Administered 2023-09-02: 1000 mg via ORAL

## 2023-09-02 MED ORDER — KETOROLAC TROMETHAMINE 30 MG/ML IJ SOLN
INTRAMUSCULAR | Status: DC | PRN
  Administered 2023-09-02: 30 mg

## 2023-09-02 MED ORDER — CHLORHEXIDINE GLUCONATE 0.12 % MT SOLN
15.0000 mL | Freq: Once | OROMUCOSAL | Status: AC
  Administered 2023-09-02: 15 mL via OROMUCOSAL

## 2023-09-02 MED ORDER — FENTANYL CITRATE PF 50 MCG/ML IJ SOSY
25.0000 ug | PREFILLED_SYRINGE | INTRAMUSCULAR | Status: DC | PRN
  Administered 2023-09-02 (×3): 50 ug via INTRAVENOUS

## 2023-09-02 MED ORDER — ORAL CARE MOUTH RINSE: 15.0000 mL | Freq: Once | OROMUCOSAL | Status: AC

## 2023-09-02 MED ORDER — PROPOFOL 10 MG/ML IV BOLUS
INTRAVENOUS | Status: AC
Start: 2023-09-02 — End: ?
  Filled 2023-09-02: qty 20

## 2023-09-02 MED ORDER — MIDAZOLAM HCL 2 MG/2ML IJ SOLN
INTRAMUSCULAR | Status: AC
  Filled 2023-09-02: qty 2

## 2023-09-02 MED ORDER — ONDANSETRON HCL 4 MG/2ML IJ SOLN
INTRAMUSCULAR | Status: AC
  Filled 2023-09-02: qty 6

## 2023-09-02 MED ORDER — POVIDONE-IODINE 10 % EX SWAB: 2.0000 | Freq: Once | CUTANEOUS | Status: DC

## 2023-09-02 MED ORDER — VANCOMYCIN HCL IN DEXTROSE 1-5 GM/200ML-% IV SOLN
1000.0000 mg | Freq: Once | INTRAVENOUS | Status: AC
  Administered 2023-09-02: 1000 mg via INTRAVENOUS
  Filled 2023-09-02: qty 200

## 2023-09-02 MED ORDER — SODIUM CHLORIDE 0.9 % IV SOLN: 12.5000 mg | INTRAVENOUS | Status: DC | PRN

## 2023-09-02 MED ORDER — ONDANSETRON HCL 4 MG/2ML IJ SOLN: 4.0000 mg | Freq: Four times a day (QID) | INTRAMUSCULAR | Status: DC | PRN

## 2023-09-02 MED ORDER — EPHEDRINE SULFATE (PRESSORS) 50 MG/ML IJ SOLN
INTRAMUSCULAR | Status: DC | PRN
  Administered 2023-09-02 (×3): 5 mg via INTRAVENOUS

## 2023-09-02 MED ORDER — FENTANYL CITRATE (PF) 100 MCG/2ML IJ SOLN
INTRAMUSCULAR | Status: AC
  Filled 2023-09-02: qty 2

## 2023-09-02 MED ORDER — VANCOMYCIN HCL IN DEXTROSE 1-5 GM/200ML-% IV SOLN: 1000.0000 mg | Freq: Once | INTRAVENOUS | Status: DC

## 2023-09-02 MED ORDER — CEFAZOLIN SODIUM-DEXTROSE 2-4 GM/100ML-% IV SOLN: 2.0000 g | Freq: Four times a day (QID) | INTRAVENOUS | Status: DC

## 2023-09-02 MED ORDER — LIDOCAINE HCL (PF) 2 % IJ SOLN
INTRAMUSCULAR | Status: AC
  Filled 2023-09-02: qty 15

## 2023-09-02 MED ORDER — PROPOFOL 10 MG/ML IV BOLUS
INTRAVENOUS | Status: DC | PRN
  Administered 2023-09-02: 40 mg via INTRAVENOUS

## 2023-09-02 MED ORDER — PHENYLEPHRINE HCL-NACL 20-0.9 MG/250ML-% IV SOLN
INTRAVENOUS | Status: DC | PRN
  Administered 2023-09-02: 45 ug/min via INTRAVENOUS

## 2023-09-02 MED ORDER — METOCLOPRAMIDE HCL 5 MG PO TABS: 5.0000 mg | ORAL_TABLET | Freq: Three times a day (TID) | ORAL | Status: DC | PRN

## 2023-09-02 MED ORDER — KETAMINE HCL 50 MG/5ML IJ SOSY
PREFILLED_SYRINGE | INTRAMUSCULAR | Status: AC
  Filled 2023-09-02: qty 5

## 2023-09-02 MED ORDER — HYDROMORPHONE HCL 1 MG/ML IJ SOLN
0.5000 mg | INTRAMUSCULAR | Status: DC | PRN
  Administered 2023-09-02: 1 mg via INTRAVENOUS

## 2023-09-02 MED ORDER — CELECOXIB 200 MG PO CAPS: 200.0000 mg | ORAL_CAPSULE | Freq: Two times a day (BID) | ORAL | Status: DC

## 2023-09-02 MED ORDER — ACETAMINOPHEN 325 MG PO TABS: 325.0000 mg | ORAL_TABLET | Freq: Four times a day (QID) | ORAL | Status: DC | PRN

## 2023-09-02 MED ORDER — ACETAMINOPHEN 500 MG PO TABS
1000.0000 mg | ORAL_TABLET | Freq: Once | ORAL | Status: DC
  Filled 2023-09-02: qty 2

## 2023-09-02 MED ORDER — MIDAZOLAM HCL 2 MG/2ML IJ SOLN
INTRAMUSCULAR | Status: DC | PRN
  Administered 2023-09-02: 1 mg via INTRAVENOUS
  Administered 2023-09-02: 2 mg via INTRAVENOUS
  Administered 2023-09-02: 1 mg via INTRAVENOUS

## 2023-09-02 MED ORDER — PROPOFOL 1000 MG/100ML IV EMUL
INTRAVENOUS | Status: AC
  Filled 2023-09-02: qty 100

## 2023-09-02 MED ORDER — KETOROLAC TROMETHAMINE 30 MG/ML IJ SOLN
INTRAMUSCULAR | Status: AC
  Filled 2023-09-02: qty 1

## 2023-09-02 MED ORDER — LACTATED RINGERS IV SOLN: INTRAVENOUS | Status: DC

## 2023-09-02 MED ORDER — ISOPROPYL ALCOHOL 70 % SOLN
Status: DC | PRN
  Administered 2023-09-02: 1 via TOPICAL

## 2023-09-02 MED ORDER — PROPOFOL 500 MG/50ML IV EMUL
INTRAVENOUS | Status: DC | PRN
  Administered 2023-09-02: 125 ug/kg/min via INTRAVENOUS

## 2023-09-02 MED ORDER — FENTANYL CITRATE PF 50 MCG/ML IJ SOSY
PREFILLED_SYRINGE | INTRAMUSCULAR | Status: AC
  Filled 2023-09-02: qty 2

## 2023-09-02 MED ORDER — HYDROMORPHONE HCL 1 MG/ML IJ SOLN
0.2500 mg | INTRAMUSCULAR | Status: DC | PRN
  Administered 2023-09-02 (×4): 0.5 mg via INTRAVENOUS

## 2023-09-02 MED ORDER — METHOCARBAMOL 1000 MG/10ML IJ SOLN
500.0000 mg | Freq: Four times a day (QID) | INTRAMUSCULAR | Status: DC | PRN
  Administered 2023-09-02: 500 mg via INTRAVENOUS

## 2023-09-02 MED ORDER — METHOCARBAMOL 500 MG PO TABS: 500.0000 mg | ORAL_TABLET | Freq: Four times a day (QID) | ORAL | Status: DC | PRN

## 2023-09-02 MED ORDER — KETAMINE HCL 10 MG/ML IJ SOLN
INTRAMUSCULAR | Status: DC | PRN
  Administered 2023-09-02 (×4): 10 mg via INTRAVENOUS

## 2023-09-02 MED ORDER — ACETAMINOPHEN 500 MG PO TABS
ORAL_TABLET | ORAL | Status: AC
  Filled 2023-09-02: qty 2

## 2023-09-02 MED ORDER — DEXAMETHASONE SODIUM PHOSPHATE 10 MG/ML IJ SOLN
INTRAMUSCULAR | Status: DC | PRN
  Administered 2023-09-02: 10 mg via INTRAVENOUS

## 2023-09-02 MED ORDER — SODIUM CHLORIDE 0.9 % IV SOLN: INTRAVENOUS | Status: DC | PRN

## 2023-09-02 MED ORDER — ROCURONIUM BROMIDE 10 MG/ML (PF) SYRINGE
PREFILLED_SYRINGE | INTRAVENOUS | Status: AC
  Filled 2023-09-02: qty 10

## 2023-09-02 MED ORDER — LACTATED RINGERS IV BOLUS
250.0000 mL | Freq: Once | INTRAVENOUS | Status: DC
Start: 2023-09-02 — End: 2023-09-02

## 2023-09-02 MED ORDER — SODIUM CHLORIDE (PF) 0.9 % IJ SOLN
INTRAMUSCULAR | Status: AC
  Filled 2023-09-02: qty 50

## 2023-09-02 MED ORDER — AMISULPRIDE (ANTIEMETIC) 5 MG/2ML IV SOLN: 10.0000 mg | Freq: Once | INTRAVENOUS | Status: DC | PRN

## 2023-09-02 MED ORDER — ACETAMINOPHEN 500 MG PO TABS
1000.0000 mg | ORAL_TABLET | Freq: Once | ORAL | Status: AC
  Administered 2023-09-02: 1000 mg via ORAL

## 2023-09-02 MED ORDER — BUPIVACAINE-EPINEPHRINE 0.25% -1:200000 IJ SOLN
INTRAMUSCULAR | Status: DC | PRN
  Administered 2023-09-02: 30 mL

## 2023-09-02 MED ORDER — FENTANYL CITRATE (PF) 100 MCG/2ML IJ SOLN
INTRAMUSCULAR | Status: DC | PRN
  Administered 2023-09-02 (×2): 50 ug via INTRAVENOUS
  Administered 2023-09-02 (×2): 25 ug via INTRAVENOUS

## 2023-09-02 MED ORDER — DEXAMETHASONE SODIUM PHOSPHATE 10 MG/ML IJ SOLN
INTRAMUSCULAR | Status: AC
Start: 2023-09-02 — End: ?
  Filled 2023-09-02: qty 3

## 2023-09-02 MED ORDER — LIDOCAINE HCL (CARDIAC) PF 100 MG/5ML IV SOSY
PREFILLED_SYRINGE | INTRAVENOUS | Status: DC | PRN
  Administered 2023-09-02: 80 mg via INTRAVENOUS

## 2023-09-02 MED ORDER — HYDROMORPHONE HCL 2 MG PO TABS: 2.0000 mg | ORAL_TABLET | ORAL | Status: DC | PRN

## 2023-09-02 MED ORDER — METHOCARBAMOL 1000 MG/10ML IJ SOLN
INTRAMUSCULAR | Status: AC
  Filled 2023-09-02: qty 10

## 2023-09-02 MED ORDER — ONDANSETRON HCL 4 MG PO TABS: 4.0000 mg | ORAL_TABLET | Freq: Four times a day (QID) | ORAL | Status: DC | PRN

## 2023-09-02 MED ORDER — METOCLOPRAMIDE HCL 5 MG/ML IJ SOLN: 5.0000 mg | Freq: Three times a day (TID) | INTRAMUSCULAR | Status: DC | PRN

## 2023-09-02 MED ORDER — TRANEXAMIC ACID-NACL 1000-0.7 MG/100ML-% IV SOLN
1000.0000 mg | INTRAVENOUS | Status: AC
  Administered 2023-09-02: 1000 mg via INTRAVENOUS
  Filled 2023-09-02: qty 100

## 2023-09-02 MED ORDER — ROPIVACAINE HCL 7.5 MG/ML IJ SOLN
INTRAMUSCULAR | Status: DC | PRN
  Administered 2023-09-02: 20 mL via PERINEURAL

## 2023-09-02 MED ORDER — LACTATED RINGERS IV BOLUS
500.0000 mL | Freq: Once | INTRAVENOUS | Status: AC
  Administered 2023-09-02: 500 mL via INTRAVENOUS

## 2023-09-02 MED ORDER — HYDROMORPHONE HCL 2 MG PO TABS: 1.0000 mg | ORAL_TABLET | ORAL | Status: DC | PRN

## 2023-09-02 SURGICAL SUPPLY — 71 items
ADH SKN CLS APL DERMABOND .7 (GAUZE/BANDAGES/DRESSINGS) ×2
APL PRP STRL LF DISP 70% ISPRP (MISCELLANEOUS) ×2
BAG COUNTER SPONGE SURGICOUNT (BAG) IMPLANT
BAG SPEC THK2 15X12 ZIP CLS (MISCELLANEOUS)
BAG SPNG CNTER NS LX DISP (BAG)
BAG ZIPLOCK 12X15 (MISCELLANEOUS) IMPLANT
BATTERY INSTRU NAVIGATION (MISCELLANEOUS) ×6 IMPLANT
BLADE SAW RECIPROCATING 77.5 (BLADE) ×2 IMPLANT
BNDG CMPR 5X4 KNIT ELC UNQ LF (GAUZE/BANDAGES/DRESSINGS) ×1
BNDG CMPR 6 X 5 YARDS HK CLSR (GAUZE/BANDAGES/DRESSINGS) ×1
BNDG ELASTIC 4INX 5YD STR LF (GAUZE/BANDAGES/DRESSINGS) ×2 IMPLANT
BNDG ELASTIC 6INX 5YD STR LF (GAUZE/BANDAGES/DRESSINGS) ×2 IMPLANT
BTRY SRG DRVR LF (MISCELLANEOUS) ×3
CHLORAPREP W/TINT 26 (MISCELLANEOUS) ×4 IMPLANT
COMP FEM KNEE PS NRW 8 LT (Joint) ×1 IMPLANT
COMP PATELLA 3 PEG 35 (Joint) ×1 IMPLANT
COMPONENT FEM KNEE PS NRW 8 LT (Joint) IMPLANT
COMPONENT PATELLA 3 PEG 35 (Joint) IMPLANT
COVER SURGICAL LIGHT HANDLE (MISCELLANEOUS) ×2 IMPLANT
DERMABOND ADVANCED .7 DNX12 (GAUZE/BANDAGES/DRESSINGS) ×4 IMPLANT
DRAPE SHEET LG 3/4 BI-LAMINATE (DRAPES) ×6 IMPLANT
DRAPE U-SHAPE 47X51 STRL (DRAPES) ×2 IMPLANT
DRSG AQUACEL AG ADV 3.5X10 (GAUZE/BANDAGES/DRESSINGS) ×2 IMPLANT
ELECT BLADE TIP CTD 4 INCH (ELECTRODE) ×2 IMPLANT
ELECT REM PT RETURN 15FT ADLT (MISCELLANEOUS) ×2 IMPLANT
GAUZE SPONGE 4X4 12PLY STRL (GAUZE/BANDAGES/DRESSINGS) ×2 IMPLANT
GLOVE BIO SURGEON STRL SZ7 (GLOVE) ×2 IMPLANT
GLOVE BIO SURGEON STRL SZ8.5 (GLOVE) ×4 IMPLANT
GLOVE BIOGEL PI IND STRL 7.5 (GLOVE) ×2 IMPLANT
GLOVE BIOGEL PI IND STRL 8.5 (GLOVE) ×2 IMPLANT
GOWN SPEC L3 XXLG W/TWL (GOWN DISPOSABLE) ×2 IMPLANT
GOWN STRL REUS W/ TWL XL LVL3 (GOWN DISPOSABLE) ×2 IMPLANT
GOWN STRL REUS W/TWL XL LVL3 (GOWN DISPOSABLE) ×1
HANDPIECE INTERPULSE COAX TIP (DISPOSABLE) ×1
HOLDER FOLEY CATH W/STRAP (MISCELLANEOUS) ×2 IMPLANT
HOOD PEEL AWAY T7 (MISCELLANEOUS) ×6 IMPLANT
IMMOBILIZER KNEE 20 (SOFTGOODS)
IMMOBILIZER KNEE 20 THIGH 36 (SOFTGOODS) IMPLANT
KIT TURNOVER KIT A (KITS) IMPLANT
LINER TIB ASF PS EF/3-11 12 L (Liner) IMPLANT
MARKER SKIN DUAL TIP RULER LAB (MISCELLANEOUS) ×2 IMPLANT
NDL SAFETY ECLIPSE 18X1.5 (NEEDLE) ×2 IMPLANT
NDL SPNL 18GX3.5 QUINCKE PK (NEEDLE) ×2 IMPLANT
NEEDLE SPNL 18GX3.5 QUINCKE PK (NEEDLE) ×1
NS IRRIG 1000ML POUR BTL (IV SOLUTION) ×2 IMPLANT
PACK TOTAL KNEE CUSTOM (KITS) ×2 IMPLANT
PADDING CAST COTTON 6X4 STRL (CAST SUPPLIES) ×2 IMPLANT
PIN DRILL HDLS TROCAR 75 4PK (PIN) IMPLANT
PROTECTOR NERVE ULNAR (MISCELLANEOUS) ×2 IMPLANT
SAW OSC TIP CART 19.5X105X1.3 (SAW) ×2 IMPLANT
SCREW FEMALE HEX FIX 25X2.5 (ORTHOPEDIC DISPOSABLE SUPPLIES) IMPLANT
SEALER BIPOLAR AQUA 6.0 (INSTRUMENTS) ×2 IMPLANT
SET HNDPC FAN SPRY TIP SCT (DISPOSABLE) ×2 IMPLANT
SET PAD KNEE POSITIONER (MISCELLANEOUS) ×2 IMPLANT
SOLUTION PRONTOSAN WOUND 350ML (IRRIGATION / IRRIGATOR) IMPLANT
SPIKE FLUID TRANSFER (MISCELLANEOUS) ×4 IMPLANT
STEM TIB PERS SZ E 5D LT (Screw) IMPLANT
SUT MNCRL AB 3-0 PS2 18 (SUTURE) ×2 IMPLANT
SUT MON AB 2-0 CT1 36 (SUTURE) ×2 IMPLANT
SUT STRATAFIX PDO 1 14 VIOLET (SUTURE) ×1
SUT STRATFX PDO 1 14 VIOLET (SUTURE) ×1
SUT VIC AB 1 CTX 36 (SUTURE) ×2
SUT VIC AB 1 CTX36XBRD ANBCTR (SUTURE) ×4 IMPLANT
SUT VIC AB 2-0 CT1 27 (SUTURE) ×1
SUT VIC AB 2-0 CT1 TAPERPNT 27 (SUTURE) ×2 IMPLANT
SUTURE STRATFX PDO 1 14 VIOLET (SUTURE) ×2 IMPLANT
SYR 3ML LL SCALE MARK (SYRINGE) ×2 IMPLANT
TRAY FOLEY MTR SLVR 16FR STAT (SET/KITS/TRAYS/PACK) IMPLANT
TUBE SUCTION HIGH CAP CLEAR NV (SUCTIONS) ×2 IMPLANT
WATER STERILE IRR 1000ML POUR (IV SOLUTION) ×4 IMPLANT
WRAP KNEE MAXI GEL POST OP (GAUZE/BANDAGES/DRESSINGS) IMPLANT

## 2023-09-02 NOTE — Evaluation (Signed)
Physical Therapy One Time Evaluation Patient Details Name: Stacie Webster MRN: [id number] DOB: 1971/07/08 Today's Date: 09/02/2023  History of Present Illness  Pt is a 52 year old female s/p Left TKA on 09/02/23.  PMHx: R TKA March 2020  Clinical Impression  Patient evaluated by Physical Therapy with no further acute PT needs identified. All education has been completed and the patient has no further questions.  Pt assisted with ambulating short distance. Pt reports mild numbness and dizziness with mobilizing.  Pt is an OPPT and declined need to practice steps and able to verbalize exercises.  Pt already has RW at home. PT is signing off. Thank you for this referral.         If plan is discharge home, recommend the following: Help with stairs or ramp for entrance   Can travel by private vehicle        Equipment Recommendations None recommended by PT  Recommendations for Other Services       Functional Status Assessment Patient has had a recent decline in their functional status and demonstrates the ability to make significant improvements in function in a reasonable and predictable amount of time.     Precautions / Restrictions Precautions Precautions: Fall;Knee Restrictions Weight Bearing Restrictions: No      Mobility  Bed Mobility Overal bed mobility: Needs Assistance Bed Mobility: Supine to Sit, Sit to Supine     Supine to sit: Contact guard Sit to supine: Contact guard assist   General bed mobility comments: CGA due to stretcher (pt seen in PACU)    Transfers Overall transfer level: Needs assistance Equipment used: Rolling walker (2 wheels) Transfers: Sit to/from Stand Sit to Stand: Contact guard assist           General transfer comment: for safety    Ambulation/Gait Ambulation/Gait assistance: Contact guard assist Gait Distance (Feet): 40 Feet Assistive device: Rolling walker (2 wheels) Gait Pattern/deviations: Step-to pattern, Decreased stance  time - left, Antalgic Gait velocity: decr     General Gait Details: slow but steady with RW, pt reports some numbness but no buckling observed; distance limited due to numbness and mild lightheadness (pt declined checking BP upon return to stretcher)  Stairs Stairs:  (pt declined needing to practice, reviewed sequence verbally, pt is an OPPT)          Armed forces technical officer Bed    Modified Rankin (Stroke Patients Only)       Balance                                             Pertinent Vitals/Pain Pain Assessment Pain Assessment: No/denies pain    Home Living Family/patient expects to be discharged to:: Private residence Living Arrangements: Spouse/significant other   Type of Home: House Home Access: Stairs to enter   Secretary/administrator of Steps: 1 Alternate Level Stairs-Number of Steps: flight Home Layout: Two level Home Equipment: Agricultural consultant (2 wheels)      Prior Function Prior Level of Function : Independent/Modified Independent             Mobility Comments: works as a Adult nurse at [employer]       Extremity/Trunk Assessment        Lower Extremity Assessment Lower Extremity Assessment: LLE deficits/detail LLE Deficits / Details: able to perform SLR, ankle pumps, observed at least  75* AAROM knee flexion; pt requested stretching of hip adductors and hip flexors x2 (discomfort from surgical positioning?); reports numbness in upper thigh and gluteal muscles       Communication   Communication Communication: No apparent difficulties  Cognition Arousal: Alert Behavior During Therapy: WFL for tasks assessed/performed Overall Cognitive Status: Within Functional Limits for tasks assessed                                          General Comments      Exercises     Assessment/Plan    PT Assessment All further PT needs can be met in the next venue of care  PT Problem List Decreased range of  motion;Decreased strength;Decreased mobility       PT Treatment Interventions      PT Goals (Current goals can be found in the Care Plan section)  Acute Rehab PT Goals PT Goal Formulation: All assessment and education complete, DC therapy    Frequency       Co-evaluation               AM-PAC PT "6 Clicks" Mobility  Outcome Measure Help needed turning from your back to your side while in a flat bed without using bedrails?: A Little Help needed moving from lying on your back to sitting on the side of a flat bed without using bedrails?: A Little Help needed moving to and from a bed to a chair (including a wheelchair)?: A Little Help needed standing up from a chair using your arms (e.g., wheelchair or bedside chair)?: A Little Help needed to walk in hospital room?: A Little Help needed climbing 3-5 steps with a railing? : A Little 6 Click Score: 18    End of Session Equipment Utilized During Treatment: Gait belt Activity Tolerance: Patient tolerated treatment well Patient left: in bed;with call bell/phone within reach;with family/visitor present Nurse Communication: Mobility status PT Visit Diagnosis: Other abnormalities of gait and mobility (R26.89)    Time: 9147-8295 PT Time Calculation (min) (ACUTE ONLY): 17 min   Charges:   PT Evaluation $PT Eval Low Complexity: 1 Low   PT General Charges $$ ACUTE PT VISIT: 1 Visit        [name] PT, DPT Physical Therapist Acute Rehabilitation Services Office: [phone]   [name] 09/02/2023, 1:43 PM

## 2023-09-02 NOTE — Transfer of Care (Signed)
Immediate Anesthesia Transfer of Care Note  Patient: Stacie Webster  Procedure(s) Performed: Procedure(s): COMPUTER ASSISTED TOTAL KNEE ARTHROPLASTY (Left)  Patient Location: PACU  Anesthesia Type:Spinal  Level of Consciousness:  sedated, patient cooperative and responds to stimulation  Airway & Oxygen Therapy:Patient Spontanous Breathing and Patient connected to face mask oxgen  Post-op Assessment:  Report given to PACU RN and Post -op Vital signs reviewed and stable  Post vital signs:  Reviewed and stable  Last Vitals:  Vitals:   09/02/23 0541  BP: (!) 113/46  Pulse: 77  Resp: 19  Temp: 36.5 C  SpO2: 99%    Complications: No apparent anesthesia complications

## 2023-09-02 NOTE — Anesthesia Procedure Notes (Signed)
Anesthesia Regional Block: Adductor canal block   Pre-Anesthetic Checklist: , timeout performed,  Correct Patient, Correct Site, Correct Laterality,  Correct Procedure, Correct Position, site marked,  Risks and benefits discussed,  Surgical consent,  Pre-op evaluation,  At surgeon's request and post-op pain management  Laterality: Left  Prep: chloraprep       Needles:  Injection technique: Single-shot  Needle Type: Echogenic Needle     Needle Length: 10cm  Needle Gauge: 21     Additional Needles:   Narrative:  Start time: 09/02/2023 7:00 AM End time: 09/02/2023 7:03 AM Injection made incrementally with aspirations every 5 mL.  Performed by: Personally  Anesthesiologist: [name], MD  Additional Notes: No pain on injection. No increased resistance to injection. Injection made in 5cc increments. Good needle visualization. Patient tolerated the procedure well.

## 2023-09-02 NOTE — Interval H&P Note (Signed)
History and Physical Interval Note:  09/02/2023 7:53 AM  Stacie Webster  has presented today for surgery, with the diagnosis of Left knee osteoarthritis.  The various methods of treatment have been discussed with the patient and family. After consideration of risks, benefits and other options for treatment, the patient has consented to  Procedure(s): COMPUTER ASSISTED TOTAL KNEE ARTHROPLASTY (Left) as a surgical intervention.  The patient's history has been reviewed, patient examined, no change in status, stable for surgery.  I have reviewed the patient's chart and labs.  Questions were answered to the patient's satisfaction.     Iline Oven Tobin Cadiente

## 2023-09-02 NOTE — Op Note (Signed)
OPERATIVE REPORT  SURGEON: Samson Frederic, MD   ASSISTANT: Clint Bolder, PA-C  PREOPERATIVE DIAGNOSIS: Primary Left knee arthritis.   POSTOPERATIVE DIAGNOSIS: Primary Left knee arthritis.   PROCEDURE: Computer assisted Left total knee arthroplasty.   IMPLANTS: Zimmer Persona PPS Cementless CR femur, size 8 Narrow. Persona 0 degree Spiked Keel OsseoTi Tibia, size E. Vivacit-E polyethelyene insert, size 12 mm, CR. OsseoTi 3-Peg patella, size 35 mm.  ANESTHESIA:  MAC, Regional, and Spinal  TOURNIQUET TIME: Not utilized.   ESTIMATED BLOOD LOSS:-200 mL    ANTIBIOTICS: 1g vancomycin.  DRAINS: None.  COMPLICATIONS: None   CONDITION: PACU - hemodynamically stable.   BRIEF CLINICAL NOTE: Stacie Webster is a 52 y.o. female with a long-standing history of Left knee arthritis. After failing conservative management, the patient was indicated for total knee arthroplasty. The risks, benefits, and alternatives to the procedure were explained, and the patient elected to proceed.  PROCEDURE IN DETAIL: Adductor canal block was obtained in the pre-op holding area. Once inside the operative room, spinal anesthesia was obtained, and a foley catheter was inserted. The patient was then positioned and the lower extremity was prepped and draped in the normal sterile surgical fashion.  A time-out was called verifying side and site of surgery. The patient received IV antibiotics within 60 minutes of beginning the procedure. A tourniquet was not utilized.   An anterior approach to the knee was performed utilizing a midvastus arthrotomy. A medial release was performed and the patellar fat pad was excised. Stryker imageless navigation was used to cut the distal femur perpendicular to the mechanical axis. A freehand patellar resection was performed, and the patella was sized an prepared with 3 lug holes.  Nagivation was used to make a neutral proximal tibia resection, taking 2 mm of bone from the less affected  medial side with 3 degrees of slope. The menisci were excised. A spacer block was placed, and the alignment and balance in extension were confirmed.   The distal femur was sized using the 3-degree external rotation guide referencing the posterior femoral cortex. The appropriate 4-in-1 cutting block was pinned into place. Rotation was checked using Whiteside's line, the epicondylar axis, and then confirmed with a spacer block in flexion. The remaining femoral cuts were performed, taking care to protect the MCL.  The tibia was sized and the trial tray was pinned into place. The remaining trail components were inserted. The knee was stable to varus and valgus stress through a full range of motion. The patella tracked centrally, and the PCL was well balanced. The trial components were removed, and the proximal tibial surface was prepared. Final components were impacted into place. The knee was tested for a final time and found to be well balanced.   The wound was copiously irrigated with Prontosan solution and normal saline using pulse lavage.  Marcaine solution was injected into the periarticular soft tissue.  The wound was closed in layers using #1 Vicryl and Stratafix for the fascia, 2-0 Vicryl for the subcutaneous fat, 2-0 Monocryl for the deep dermal layer, 3-0 running Monocryl subcuticular Stitch, and 4-0 Monocryl stay sutures at both ends of the wound. Dermabond was applied to the skin.  Once the glue was fully dried, an Aquacell Ag and compressive dressing were applied.  The patient was transported to the recovery room in stable condition.  Sponge, needle, and instrument counts were correct at the end of the case x2.  The patient tolerated the procedure well and there were no known  complications.  The aquamantis was utilized for this case to help facilitate better hemostasis as patient was felt to be at increased risk of bleeding because of complex case requiring increased OR time and/or exposure.   -minimally invasive approach.  A oscillating saw tip was utilized for this case to prevent damage to the soft tissue structures such as muscles, ligaments and tendons, and to ensure accurate bone cuts. This patient was at increased risk for above structures due to  minimally invasive approach.  Please note that a surgical assistant was a medical necessity for this procedure in order to perform it in a safe and expeditious manner. Surgical assistant was necessary to retract the ligaments and vital neurovascular structures to prevent injury to them and also necessary for proper positioning of the limb to allow for anatomic placement of the prosthesis.

## 2023-09-02 NOTE — Anesthesia Procedure Notes (Signed)
Spinal  Patient location during procedure: OR Start time: 09/02/2023 8:03 AM End time: 09/02/2023 8:08 AM Reason for block: surgical anesthesia Staffing Performed: anesthesiologist  Anesthesiologist: [name], MD Performed by: [name], MD Authorized by: [name], MD   Preanesthetic Checklist Completed: patient identified, IV checked, risks and benefits discussed, surgical consent, monitors and equipment checked, pre-op evaluation and timeout performed Spinal Block Patient position: sitting Prep: DuraPrep Patient monitoring: heart rate, cardiac monitor, continuous pulse ox and blood pressure Approach: midline Location: L2-3 Injection technique: single-shot Needle Needle type: Quincke  Needle gauge: 22 G Assessment Events: second provider Additional Notes Consent was obtained prior to the procedure with all questions answered and concerns addressed. Risks including, but not limited to, bleeding, infection, nerve damage, paralysis, failed block, inadequate analgesia, allergic reaction, high spinal, itching, and headache were discussed and the patient wished to proceed. Functioning IV was confirmed and monitors were applied. Sterile prep and drape, including hand hygiene, mask, and sterile gloves were used. The patient was positioned and the spine was prepped. The skin was anesthetized with lidocaine. Free flow of clear CSF was obtained prior to injecting local anesthetic into the CSF. The spinal needle aspirated freely following injection. The needle was carefully withdrawn. The patient tolerated the procedure well.   Leslye Peer, MD

## 2023-09-02 NOTE — Discharge Instructions (Signed)

## 2023-09-02 NOTE — Anesthesia Postprocedure Evaluation (Signed)
Anesthesia Post Note  Patient: Stacie Webster  Procedure(s) Performed: COMPUTER ASSISTED TOTAL KNEE ARTHROPLASTY (Left: Knee)     Patient location during evaluation: PACU Anesthesia Type: Spinal Level of consciousness: awake and alert Pain management: pain level controlled Vital Signs Assessment: post-procedure vital signs reviewed and stable Respiratory status: spontaneous breathing and respiratory function stable Cardiovascular status: blood pressure returned to baseline and stable Postop Assessment: spinal receding and no apparent nausea or vomiting Anesthetic complications: no   No notable events documented.  Last Vitals:  Vitals:   09/02/23 1215 09/02/23 1230  BP: 125/64 113/80  Pulse: 86 65  Resp: 18 14  Temp:    SpO2: 97% 94%    Last Pain:  Vitals:   09/02/23 1230  TempSrc:   PainSc: 6     LLE Motor Response: Purposeful movement (09/02/23 1230)   RLE Motor Response: Purposeful movement (09/02/23 1230)   L Sensory Level: S1-Sole of foot, small toes (09/02/23 1230) R Sensory Level: S1-Sole of foot, small toes (09/02/23 1230)  [name]

## 2023-09-03 ENCOUNTER — Encounter (HOSPITAL_COMMUNITY): Payer: Self-pay | Admitting: Orthopedic Surgery

## 2023-09-08 ENCOUNTER — Other Ambulatory Visit (HOSPITAL_BASED_OUTPATIENT_CLINIC_OR_DEPARTMENT_OTHER): Payer: Self-pay

## 2023-09-08 MED ORDER — HYDROMORPHONE HCL 2 MG PO TABS
2.0000 mg | ORAL_TABLET | ORAL | 0 refills | Status: DC
  Filled 2023-09-08: qty 42, 7d supply, fill #0

## 2023-09-08 MED ORDER — METHOCARBAMOL 500 MG PO TABS
500.0000 mg | ORAL_TABLET | Freq: Four times a day (QID) | ORAL | 0 refills | Status: DC | PRN
  Filled 2023-09-08: qty 30, 8d supply, fill #0

## 2023-09-17 ENCOUNTER — Other Ambulatory Visit (HOSPITAL_BASED_OUTPATIENT_CLINIC_OR_DEPARTMENT_OTHER): Payer: Self-pay

## 2023-09-17 DIAGNOSIS — Z96652 Presence of left artificial knee joint: Secondary | ICD-10-CM | POA: Diagnosis not present

## 2023-09-17 DIAGNOSIS — Z471 Aftercare following joint replacement surgery: Secondary | ICD-10-CM | POA: Diagnosis not present

## 2023-09-17 MED ORDER — HYDROCODONE-ACETAMINOPHEN 5-325 MG PO TABS
1.0000 | ORAL_TABLET | Freq: Four times a day (QID) | ORAL | 0 refills | Status: DC | PRN
  Filled 2023-09-17: qty 42, 6d supply, fill #0

## 2023-09-22 ENCOUNTER — Other Ambulatory Visit (HOSPITAL_BASED_OUTPATIENT_CLINIC_OR_DEPARTMENT_OTHER): Payer: Self-pay

## 2023-09-22 MED ORDER — HYDROCODONE-ACETAMINOPHEN 5-325 MG PO TABS
1.0000 | ORAL_TABLET | Freq: Four times a day (QID) | ORAL | 0 refills | Status: DC | PRN
  Filled 2023-09-24: qty 42, 6d supply, fill #0

## 2023-09-24 ENCOUNTER — Other Ambulatory Visit: Payer: Self-pay

## 2023-09-24 ENCOUNTER — Other Ambulatory Visit (HOSPITAL_BASED_OUTPATIENT_CLINIC_OR_DEPARTMENT_OTHER): Payer: Self-pay

## 2023-09-24 ENCOUNTER — Other Ambulatory Visit (HOSPITAL_COMMUNITY): Payer: Self-pay

## 2023-09-27 ENCOUNTER — Other Ambulatory Visit (HOSPITAL_BASED_OUTPATIENT_CLINIC_OR_DEPARTMENT_OTHER): Payer: Self-pay

## 2023-09-28 ENCOUNTER — Other Ambulatory Visit (HOSPITAL_BASED_OUTPATIENT_CLINIC_OR_DEPARTMENT_OTHER): Payer: Self-pay

## 2023-09-28 MED ORDER — HYDROCODONE-ACETAMINOPHEN 5-325 MG PO TABS
1.0000 | ORAL_TABLET | Freq: Four times a day (QID) | ORAL | 0 refills | Status: DC | PRN
  Filled 2023-09-28: qty 42, 6d supply, fill #0

## 2023-10-11 ENCOUNTER — Other Ambulatory Visit (HOSPITAL_BASED_OUTPATIENT_CLINIC_OR_DEPARTMENT_OTHER): Payer: Self-pay

## 2023-10-11 ENCOUNTER — Other Ambulatory Visit: Payer: Self-pay

## 2023-10-11 DIAGNOSIS — L821 Other seborrheic keratosis: Secondary | ICD-10-CM | POA: Diagnosis not present

## 2023-10-11 DIAGNOSIS — D225 Melanocytic nevi of trunk: Secondary | ICD-10-CM | POA: Diagnosis not present

## 2023-10-11 DIAGNOSIS — D2272 Melanocytic nevi of left lower limb, including hip: Secondary | ICD-10-CM | POA: Diagnosis not present

## 2023-10-11 DIAGNOSIS — D1801 Hemangioma of skin and subcutaneous tissue: Secondary | ICD-10-CM | POA: Diagnosis not present

## 2023-10-11 DIAGNOSIS — L570 Actinic keratosis: Secondary | ICD-10-CM | POA: Diagnosis not present

## 2023-10-11 DIAGNOSIS — D2271 Melanocytic nevi of right lower limb, including hip: Secondary | ICD-10-CM | POA: Diagnosis not present

## 2023-10-11 DIAGNOSIS — L814 Other melanin hyperpigmentation: Secondary | ICD-10-CM | POA: Diagnosis not present

## 2023-10-11 MED ORDER — FLUOROURACIL 5 % EX CREA
1.0000 | TOPICAL_CREAM | Freq: Two times a day (BID) | CUTANEOUS | 0 refills | Status: AC
  Filled 2023-10-11: qty 40, 15d supply, fill #0

## 2023-10-11 MED ORDER — TRETINOIN 0.05 % EX CREA
1.0000 | TOPICAL_CREAM | Freq: Every evening | CUTANEOUS | 11 refills | Status: AC
  Filled 2023-10-11: qty 20, 30d supply, fill #0

## 2023-10-12 ENCOUNTER — Other Ambulatory Visit (HOSPITAL_BASED_OUTPATIENT_CLINIC_OR_DEPARTMENT_OTHER): Payer: Self-pay

## 2023-10-12 MED ORDER — TRAMADOL HCL 50 MG PO TABS
50.0000 mg | ORAL_TABLET | Freq: Four times a day (QID) | ORAL | 0 refills | Status: DC | PRN
  Filled 2023-10-12: qty 28, 7d supply, fill #0

## 2023-10-18 DIAGNOSIS — Z471 Aftercare following joint replacement surgery: Secondary | ICD-10-CM | POA: Diagnosis not present

## 2023-10-18 DIAGNOSIS — Z96652 Presence of left artificial knee joint: Secondary | ICD-10-CM | POA: Diagnosis not present

## 2023-10-19 ENCOUNTER — Other Ambulatory Visit (HOSPITAL_BASED_OUTPATIENT_CLINIC_OR_DEPARTMENT_OTHER): Payer: Self-pay

## 2023-10-19 MED ORDER — TRAMADOL HCL 50 MG PO TABS
50.0000 mg | ORAL_TABLET | Freq: Three times a day (TID) | ORAL | 0 refills | Status: DC
  Filled 2023-10-19: qty 21, 7d supply, fill #0

## 2023-11-09 DIAGNOSIS — Z01419 Encounter for gynecological examination (general) (routine) without abnormal findings: Secondary | ICD-10-CM | POA: Diagnosis not present

## 2023-11-09 DIAGNOSIS — Z1231 Encounter for screening mammogram for malignant neoplasm of breast: Secondary | ICD-10-CM | POA: Diagnosis not present

## 2023-11-09 DIAGNOSIS — Z124 Encounter for screening for malignant neoplasm of cervix: Secondary | ICD-10-CM | POA: Diagnosis not present

## 2023-11-09 DIAGNOSIS — Z6835 Body mass index (BMI) 35.0-35.9, adult: Secondary | ICD-10-CM | POA: Diagnosis not present

## 2023-11-12 DIAGNOSIS — Z13820 Encounter for screening for osteoporosis: Secondary | ICD-10-CM | POA: Diagnosis not present

## 2023-11-22 ENCOUNTER — Other Ambulatory Visit (HOSPITAL_BASED_OUTPATIENT_CLINIC_OR_DEPARTMENT_OTHER): Payer: Self-pay

## 2023-12-22 ENCOUNTER — Other Ambulatory Visit (HOSPITAL_BASED_OUTPATIENT_CLINIC_OR_DEPARTMENT_OTHER): Payer: Self-pay

## 2023-12-24 ENCOUNTER — Other Ambulatory Visit (HOSPITAL_BASED_OUTPATIENT_CLINIC_OR_DEPARTMENT_OTHER): Payer: Self-pay

## 2023-12-24 MED ORDER — CELECOXIB 200 MG PO CAPS
200.0000 mg | ORAL_CAPSULE | Freq: Every day | ORAL | 3 refills | Status: DC | PRN
  Filled 2023-12-24: qty 30, 30d supply, fill #0
  Filled 2024-01-24: qty 30, 30d supply, fill #1
  Filled 2024-02-22: qty 30, 30d supply, fill #2
  Filled 2024-04-22: qty 30, 30d supply, fill #3

## 2024-01-05 ENCOUNTER — Ambulatory Visit (INDEPENDENT_AMBULATORY_CARE_PROVIDER_SITE_OTHER): Payer: 59 | Admitting: Family Medicine

## 2024-01-05 ENCOUNTER — Other Ambulatory Visit: Payer: Self-pay

## 2024-01-05 ENCOUNTER — Encounter: Payer: Self-pay | Admitting: Family Medicine

## 2024-01-05 ENCOUNTER — Other Ambulatory Visit (HOSPITAL_BASED_OUTPATIENT_CLINIC_OR_DEPARTMENT_OTHER): Payer: Self-pay

## 2024-01-05 VITALS — BP 112/70 | HR 89 | Temp 98.1°F | Ht 64.5 in | Wt 207.5 lb

## 2024-01-05 DIAGNOSIS — E669 Obesity, unspecified: Secondary | ICD-10-CM | POA: Diagnosis not present

## 2024-01-05 DIAGNOSIS — N951 Menopausal and female climacteric states: Secondary | ICD-10-CM | POA: Diagnosis not present

## 2024-01-05 DIAGNOSIS — Z0000 Encounter for general adult medical examination without abnormal findings: Secondary | ICD-10-CM | POA: Diagnosis not present

## 2024-01-05 DIAGNOSIS — Z1159 Encounter for screening for other viral diseases: Secondary | ICD-10-CM | POA: Diagnosis not present

## 2024-01-05 DIAGNOSIS — Z114 Encounter for screening for human immunodeficiency virus [HIV]: Secondary | ICD-10-CM | POA: Diagnosis not present

## 2024-01-05 LAB — BASIC METABOLIC PANEL
BUN: 21 mg/dL (ref 6–23)
CO2: 30 meq/L (ref 19–32)
Calcium: 9.6 mg/dL (ref 8.4–10.5)
Chloride: 102 meq/L (ref 96–112)
Creatinine, Ser: 0.75 mg/dL (ref 0.40–1.20)
GFR: 91.15 mL/min (ref >60.00)
Glucose, Bld: 82 mg/dL (ref 70–99)
Potassium: 4.3 meq/L (ref 3.5–5.1)
Sodium: 138 meq/L (ref 135–145)

## 2024-01-05 LAB — CBC WITH DIFFERENTIAL/PLATELET
Basophils Absolute: 0.1 10*3/uL (ref 0.0–0.1)
Basophils Relative: 1.2 % (ref 0.0–3.0)
Eosinophils Absolute: 0.1 10*3/uL (ref 0.0–0.7)
Eosinophils Relative: 2.4 % (ref 0.0–5.0)
HCT: 42.3 % (ref 36.0–46.0)
Hemoglobin: 14.5 g/dL (ref 12.0–15.0)
Lymphocytes Relative: 26.3 % (ref 12.0–46.0)
Lymphs Abs: 1.4 10*3/uL (ref 0.7–4.0)
MCHC: 34.2 g/dL (ref 30.0–36.0)
MCV: 89.7 fl (ref 78.0–100.0)
Monocytes Absolute: 0.5 10*3/uL (ref 0.1–1.0)
Monocytes Relative: 8.8 % (ref 3.0–12.0)
Neutro Abs: 3.3 10*3/uL (ref 1.4–7.7)
Neutrophils Relative %: 61.3 % (ref 43.0–77.0)
Platelets: 400 10*3/uL (ref 150.0–400.0)
RBC: 4.71 Mil/uL (ref 3.87–5.11)
RDW: 12.8 % (ref 11.5–15.5)
WBC: 5.4 10*3/uL (ref 4.0–10.5)

## 2024-01-05 LAB — HEPATIC FUNCTION PANEL
ALT: 11 U/L (ref 0–35)
AST: 16 U/L (ref 0–37)
Albumin: 4.7 g/dL (ref 3.5–5.2)
Alkaline Phosphatase: 52 U/L (ref 39–117)
Bilirubin, Direct: 0.2 mg/dL (ref 0.0–0.3)
Total Bilirubin: 0.8 mg/dL (ref 0.2–1.2)
Total Protein: 7.8 g/dL (ref 6.0–8.3)

## 2024-01-05 LAB — LIPID PANEL
Cholesterol: 199 mg/dL (ref 0–200)
HDL: 72.8 mg/dL (ref >39.00)
LDL Cholesterol: 104 mg/dL — ABNORMAL HIGH (ref 0–99)
NonHDL: 125.87
Total CHOL/HDL Ratio: 3
Triglycerides: 110 mg/dL (ref 0.0–149.0)
VLDL: 22 mg/dL (ref 0.0–40.0)

## 2024-01-05 LAB — VITAMIN D 25 HYDROXY (VIT D DEFICIENCY, FRACTURES): VITD: 23.44 ng/mL — ABNORMAL LOW (ref 30.00–100.00)

## 2024-01-05 LAB — TSH: TSH: 1.43 u[IU]/mL (ref 0.35–5.50)

## 2024-01-05 LAB — TESTOSTERONE: Testosterone: 277.02 ng/dL — ABNORMAL HIGH (ref 15.00–40.00)

## 2024-01-05 MED ORDER — ESTRADIOL 0.0375 MG/24HR TD PTTW
1.0000 | MEDICATED_PATCH | TRANSDERMAL | 7 refills | Status: DC
  Filled 2024-01-05: qty 8, 28d supply, fill #0
  Filled 2024-01-28: qty 8, 28d supply, fill #1
  Filled 2024-02-22: qty 8, 28d supply, fill #2
  Filled 2024-03-27: qty 8, 28d supply, fill #3
  Filled 2024-04-22: qty 8, 28d supply, fill #4
  Filled 2024-05-18: qty 8, 28d supply, fill #5
  Filled 2024-06-16: qty 8, 28d supply, fill #6
  Filled 2024-07-14: qty 8, 28d supply, fill #7

## 2024-01-05 MED ORDER — PROGESTERONE MICRONIZED 100 MG PO CAPS
200.0000 mg | ORAL_CAPSULE | Freq: Every day | ORAL | 2 refills | Status: DC
  Filled 2024-01-05 – 2024-02-22 (×4): qty 180, 90d supply, fill #0
  Filled 2024-06-01: qty 180, 90d supply, fill #1
  Filled 2024-08-09 – 2024-08-14 (×3): qty 180, 90d supply, fill #2

## 2024-01-05 NOTE — Assessment & Plan Note (Signed)
Pt's PE WNL w/ exception of BMI.  UTD on pap, mammo, colonoscopy, Tdap, flu.  Check labs.  Anticipatory guidance provided.  

## 2024-01-05 NOTE — Patient Instructions (Signed)
Follow up in 1 year or as needed We'll notify you of your lab results and make any changes if needed Continue to work on healthy diet and regular exercise- you're doing great! Call with any questions or concerns Stay Safe!  Stay Healthy! Happy [birth year] Birth[employer]!!!

## 2024-01-05 NOTE — Progress Notes (Signed)
   Subjective:    Patient ID: Stacie Webster, female    DOB: [birth year], 53 y.o.   MRN: [id number]  HPI CPE- UTD on pap, mammo, colonoscopy, Tdap, flu  Patient Care Team    Relationship Specialty Notifications Start End  Sheliah Hatch, MD PCP - General   10/15/10    Comment: Jalene Mullet, [facility]    12/30/22    Comment: Dr Huntley Dec     Health Maintenance  Topic Date Due   HIV Screening  Never done   Hepatitis C Screening  Never done   Zoster Vaccines- Shingrix (1 of 2) Never done   COVID-19 Vaccine (2 - 2024-25 season) 07/04/2023   MAMMOGRAM  10/22/2023   Cervical Cancer Screening (HPV/Pap Cotest)  10/22/2025   Colonoscopy  03/09/2028   DTaP/Tdap/Td (2 - Td or Tdap) 08/30/2028   INFLUENZA VACCINE  Completed   HPV VACCINES  Aged Out      Review of Systems Patient reports no vision/ hearing changes, adenopathy,fever, weight change,  persistant/recurrent hoarseness , swallowing issues, chest pain, palpitations, edema, persistant/recurrent cough, hemoptysis, dyspnea (rest/exertional/paroxysmal nocturnal), gastrointestinal bleeding (melena, rectal bleeding), abdominal pain, significant heartburn, bowel changes, GU symptoms (dysuria, hematuria, incontinence), Gyn symptoms (abnormal  bleeding, pain),  syncope, focal weakness, memory loss, numbness & tingling, skin/hair/nail changes, abnormal bruising or bleeding, anxiety, or depression.     Objective:   Physical Exam General Appearance:    Alert, cooperative, no distress, appears stated age, obese  Head:    Normocephalic, without obvious abnormality, atraumatic  Eyes:    PERRL, conjunctiva/corneas clear, EOM's intact both eyes  Ears:    Normal TM's and external ear canals, both ears  Nose:   Nares normal, septum midline, mucosa normal, no drainage    or sinus tenderness  Throat:   Lips, mucosa, and tongue normal; teeth and gums normal  Neck:   Supple, symmetrical, trachea midline, no adenopathy;     Thyroid: no enlargement/tenderness/nodules  Back:     Symmetric, no curvature, ROM normal, no CVA tenderness  Lungs:     Clear to auscultation bilaterally, respirations unlabored  Chest Wall:    No tenderness or deformity   Heart:    Regular rate and rhythm, S1 and S2 normal, no murmur, rub   or gallop  Breast Exam:    Deferred to GYN  Abdomen:     Soft, non-tender, bowel sounds active all four quadrants,    no masses, no organomegaly  Genitalia:    Deferred to GYN  Rectal:    Extremities:   Extremities normal, atraumatic, no cyanosis or edema  Pulses:   2+ and symmetric all extremities  Skin:   Skin color, texture, turgor normal, no rashes or lesions  Lymph nodes:   Cervical, supraclavicular, and axillary nodes normal  Neurologic:   CNII-XII intact, normal strength, sensation and reflexes    throughout          Assessment & Plan:

## 2024-01-06 ENCOUNTER — Encounter: Payer: Self-pay | Admitting: Family Medicine

## 2024-01-06 ENCOUNTER — Other Ambulatory Visit: Payer: Self-pay

## 2024-01-06 ENCOUNTER — Other Ambulatory Visit (HOSPITAL_BASED_OUTPATIENT_CLINIC_OR_DEPARTMENT_OTHER): Payer: Self-pay

## 2024-01-06 ENCOUNTER — Telehealth: Payer: Self-pay

## 2024-01-06 LAB — PROGESTERONE: Progesterone: 2.4 ng/mL

## 2024-01-06 LAB — HIV ANTIBODY (ROUTINE TESTING W REFLEX): HIV 1&2 Ab, 4th Generation: NONREACTIVE

## 2024-01-06 LAB — ESTRADIOL: Estradiol: 27 pg/mL

## 2024-01-06 LAB — HEPATITIS C ANTIBODY: Hepatitis C Ab: NONREACTIVE

## 2024-01-06 MED ORDER — VITAMIN D (ERGOCALCIFEROL) 1.25 MG (50000 UNIT) PO CAPS
50000.0000 [IU] | ORAL_CAPSULE | ORAL | 0 refills | Status: AC
  Filled 2024-01-06: qty 12, 84d supply, fill #0

## 2024-01-06 NOTE — Telephone Encounter (Signed)
 Pt has reviewed labs  Vitamin D has been sent in

## 2024-01-06 NOTE — Telephone Encounter (Signed)
-----   Message from Neena Rhymes sent at 01/06/2024  7:45 AM EST ----- Vit D is low.  Based on this, we need to start 50,000 units weekly x12 weeks in addition to daily OTC supplement of at least 2000 units.   Testosterone is elevated but you are on supplementation so this isn't surprising.  Estradiol is definitely at the low end  Remainder of labs are stable and look good

## 2024-02-16 DIAGNOSIS — Z471 Aftercare following joint replacement surgery: Secondary | ICD-10-CM | POA: Diagnosis not present

## 2024-02-16 DIAGNOSIS — Z96652 Presence of left artificial knee joint: Secondary | ICD-10-CM | POA: Diagnosis not present

## 2024-02-22 ENCOUNTER — Other Ambulatory Visit: Payer: Self-pay

## 2024-02-22 ENCOUNTER — Other Ambulatory Visit (HOSPITAL_BASED_OUTPATIENT_CLINIC_OR_DEPARTMENT_OTHER): Payer: Self-pay

## 2024-06-01 ENCOUNTER — Other Ambulatory Visit (HOSPITAL_BASED_OUTPATIENT_CLINIC_OR_DEPARTMENT_OTHER): Payer: Self-pay

## 2024-06-01 MED ORDER — CELECOXIB 200 MG PO CAPS
200.0000 mg | ORAL_CAPSULE | Freq: Every day | ORAL | 3 refills | Status: AC
  Filled 2024-06-01: qty 30, 30d supply, fill #0
  Filled 2024-07-14: qty 30, 30d supply, fill #1

## 2024-06-21 DIAGNOSIS — N951 Menopausal and female climacteric states: Secondary | ICD-10-CM | POA: Diagnosis not present

## 2024-07-14 ENCOUNTER — Other Ambulatory Visit: Payer: Self-pay

## 2024-08-09 ENCOUNTER — Other Ambulatory Visit (HOSPITAL_BASED_OUTPATIENT_CLINIC_OR_DEPARTMENT_OTHER): Payer: Self-pay

## 2024-08-09 ENCOUNTER — Other Ambulatory Visit: Payer: Self-pay

## 2024-08-09 MED ORDER — ESTRADIOL 0.0375 MG/24HR TD PTTW
1.0000 | MEDICATED_PATCH | TRANSDERMAL | 3 refills | Status: DC
  Filled 2024-08-09: qty 8, 28d supply, fill #0
  Filled 2024-09-04: qty 8, 28d supply, fill #1
  Filled 2024-10-08: qty 8, 28d supply, fill #2
  Filled 2024-10-24 – 2024-11-01 (×2): qty 8, 28d supply, fill #3

## 2024-08-11 ENCOUNTER — Other Ambulatory Visit (HOSPITAL_BASED_OUTPATIENT_CLINIC_OR_DEPARTMENT_OTHER): Payer: Self-pay

## 2024-08-14 ENCOUNTER — Other Ambulatory Visit (HOSPITAL_BASED_OUTPATIENT_CLINIC_OR_DEPARTMENT_OTHER): Payer: Self-pay

## 2024-08-14 ENCOUNTER — Other Ambulatory Visit: Payer: Self-pay

## 2024-08-15 ENCOUNTER — Other Ambulatory Visit: Payer: Self-pay

## 2024-08-16 ENCOUNTER — Other Ambulatory Visit (HOSPITAL_BASED_OUTPATIENT_CLINIC_OR_DEPARTMENT_OTHER): Payer: Self-pay

## 2024-08-16 DIAGNOSIS — M7711 Lateral epicondylitis, right elbow: Secondary | ICD-10-CM | POA: Diagnosis not present

## 2024-10-02 DIAGNOSIS — N951 Menopausal and female climacteric states: Secondary | ICD-10-CM | POA: Diagnosis not present

## 2024-10-16 ENCOUNTER — Other Ambulatory Visit: Payer: Self-pay

## 2024-10-16 ENCOUNTER — Other Ambulatory Visit (HOSPITAL_BASED_OUTPATIENT_CLINIC_OR_DEPARTMENT_OTHER): Payer: Self-pay

## 2024-10-16 DIAGNOSIS — D224 Melanocytic nevi of scalp and neck: Secondary | ICD-10-CM | POA: Diagnosis not present

## 2024-10-16 DIAGNOSIS — D2271 Melanocytic nevi of right lower limb, including hip: Secondary | ICD-10-CM | POA: Diagnosis not present

## 2024-10-16 DIAGNOSIS — L570 Actinic keratosis: Secondary | ICD-10-CM | POA: Diagnosis not present

## 2024-10-16 DIAGNOSIS — D2272 Melanocytic nevi of left lower limb, including hip: Secondary | ICD-10-CM | POA: Diagnosis not present

## 2024-10-16 DIAGNOSIS — C44729 Squamous cell carcinoma of skin of left lower limb, including hip: Secondary | ICD-10-CM | POA: Diagnosis not present

## 2024-10-16 DIAGNOSIS — D1801 Hemangioma of skin and subcutaneous tissue: Secondary | ICD-10-CM | POA: Diagnosis not present

## 2024-10-16 DIAGNOSIS — L814 Other melanin hyperpigmentation: Secondary | ICD-10-CM | POA: Diagnosis not present

## 2024-10-16 DIAGNOSIS — D225 Melanocytic nevi of trunk: Secondary | ICD-10-CM | POA: Diagnosis not present

## 2024-10-16 DIAGNOSIS — D2261 Melanocytic nevi of right upper limb, including shoulder: Secondary | ICD-10-CM | POA: Diagnosis not present

## 2024-10-16 DIAGNOSIS — D2262 Melanocytic nevi of left upper limb, including shoulder: Secondary | ICD-10-CM | POA: Diagnosis not present

## 2024-10-16 DIAGNOSIS — L718 Other rosacea: Secondary | ICD-10-CM | POA: Diagnosis not present

## 2024-10-16 MED ORDER — METRONIDAZOLE 0.75 % EX CREA
1.0000 | TOPICAL_CREAM | Freq: Two times a day (BID) | CUTANEOUS | 11 refills | Status: AC
  Filled 2024-10-16: qty 45, 23d supply, fill #0

## 2024-10-24 ENCOUNTER — Other Ambulatory Visit (HOSPITAL_BASED_OUTPATIENT_CLINIC_OR_DEPARTMENT_OTHER): Payer: Self-pay

## 2024-11-01 ENCOUNTER — Other Ambulatory Visit (HOSPITAL_BASED_OUTPATIENT_CLINIC_OR_DEPARTMENT_OTHER): Payer: Self-pay

## 2024-11-01 ENCOUNTER — Encounter: Payer: Self-pay | Admitting: Family Medicine

## 2024-11-01 MED ORDER — OSELTAMIVIR PHOSPHATE 75 MG PO CAPS
75.0000 mg | ORAL_CAPSULE | Freq: Every day | ORAL | 0 refills | Status: AC
  Filled 2024-11-01: qty 10, 10d supply, fill #0

## 2024-11-01 NOTE — Telephone Encounter (Signed)
 It appears tamiflu  has been sent.

## 2024-11-01 NOTE — Telephone Encounter (Signed)
Patient responded.

## 2024-11-01 NOTE — Telephone Encounter (Signed)
 Patients husband tested positive for flu today and she is questioning if she should have tamiflu  prescribed for herself?

## 2024-11-06 ENCOUNTER — Other Ambulatory Visit (HOSPITAL_BASED_OUTPATIENT_CLINIC_OR_DEPARTMENT_OTHER): Payer: Self-pay

## 2024-11-06 ENCOUNTER — Other Ambulatory Visit: Payer: Self-pay

## 2024-11-06 MED ORDER — PHENTERMINE HCL 37.5 MG PO TABS
37.5000 mg | ORAL_TABLET | Freq: Every day | ORAL | 3 refills | Status: AC
  Filled 2024-11-06: qty 30, 30d supply, fill #0

## 2024-11-06 MED ORDER — PROGESTERONE MICRONIZED 100 MG PO CAPS
200.0000 mg | ORAL_CAPSULE | Freq: Every day | ORAL | 2 refills | Status: AC
  Filled 2024-11-06: qty 180, 90d supply, fill #0

## 2024-11-06 MED ORDER — ESTRADIOL 0.0375 MG/24HR TD PTTW
1.0000 | MEDICATED_PATCH | TRANSDERMAL | 3 refills | Status: AC
  Filled 2024-11-06 – 2024-11-29 (×2): qty 24, 84d supply, fill #0

## 2024-11-09 ENCOUNTER — Other Ambulatory Visit (HOSPITAL_BASED_OUTPATIENT_CLINIC_OR_DEPARTMENT_OTHER): Payer: Self-pay

## 2024-11-09 ENCOUNTER — Other Ambulatory Visit: Payer: Self-pay

## 2024-11-09 MED ORDER — FOSFOMYCIN TROMETHAMINE 3 G PO PACK
3.0000 g | PACK | Freq: Every day | ORAL | 0 refills | Status: AC
  Filled 2024-11-09: qty 1, 1d supply, fill #0

## 2024-11-29 ENCOUNTER — Other Ambulatory Visit (HOSPITAL_BASED_OUTPATIENT_CLINIC_OR_DEPARTMENT_OTHER): Payer: Self-pay

## 2024-11-30 ENCOUNTER — Other Ambulatory Visit: Payer: Self-pay

## 2024-12-01 ENCOUNTER — Other Ambulatory Visit: Payer: Self-pay
